# Patient Record
Sex: Male | Born: 1970 | Race: White | Hispanic: No | Smoking: Current every day smoker
Health system: Southern US, Community
[De-identification: ages and names within clinical notes are randomized; demographics above are authoritative.]

## PROBLEM LIST (undated history)

## (undated) ENCOUNTER — Emergency Department (HOSPITAL_BASED_OUTPATIENT_CLINIC_OR_DEPARTMENT_OTHER): Admission: EM | Source: Home / Self Care

## (undated) DIAGNOSIS — Z789 Other specified health status: Secondary | ICD-10-CM

## (undated) DIAGNOSIS — F1920 Other psychoactive substance dependence, uncomplicated: Secondary | ICD-10-CM

## (undated) DIAGNOSIS — F32A Depression, unspecified: Secondary | ICD-10-CM

## (undated) HISTORY — DX: Depression, unspecified: F32.A

## (undated) HISTORY — PX: NO PAST SURGERIES: SHX2092

## (undated) HISTORY — DX: Other psychoactive substance dependence, uncomplicated: F19.20

## (undated) SURGERY — Surgical Case
Anesthesia: *Unknown

---

## 2009-06-25 ENCOUNTER — Ambulatory Visit: Payer: Self-pay | Admitting: Diagnostic Radiology

## 2009-06-25 ENCOUNTER — Emergency Department (HOSPITAL_BASED_OUTPATIENT_CLINIC_OR_DEPARTMENT_OTHER): Admission: EM | Admit: 2009-06-25 | Discharge: 2009-06-25 | Payer: Self-pay | Admitting: Emergency Medicine

## 2011-06-28 ENCOUNTER — Emergency Department (HOSPITAL_COMMUNITY)
Admission: EM | Admit: 2011-06-28 | Discharge: 2011-06-28 | Disposition: A | Discharge status: Home / Self Care | Payer: Self-pay | Attending: Emergency Medicine | Admitting: Emergency Medicine

## 2011-06-28 ENCOUNTER — Encounter (HOSPITAL_COMMUNITY): Payer: Self-pay | Admitting: Emergency Medicine

## 2011-06-28 ENCOUNTER — Emergency Department (HOSPITAL_COMMUNITY): Payer: Self-pay

## 2011-06-28 DIAGNOSIS — M25519 Pain in unspecified shoulder: Secondary | ICD-10-CM | POA: Insufficient documentation

## 2011-06-28 DIAGNOSIS — M25512 Pain in left shoulder: Secondary | ICD-10-CM

## 2011-06-28 MED ORDER — IBUPROFEN 600 MG PO TABS: 600.0000 mg | ORAL_TABLET | Freq: Four times a day (QID) | ORAL | Status: AC | PRN

## 2011-06-28 MED ORDER — TRAMADOL HCL 50 MG PO TABS: 50.0000 mg | ORAL_TABLET | Freq: Four times a day (QID) | ORAL | Status: AC | PRN

## 2011-06-28 NOTE — ED Provider Notes (Signed)
History     CSN: [id number]  Arrival date & time 06/28/11  1340   First MD Initiated Contact with Patient 06/28/11 1353      Chief Complaint  Patient presents with  . Shoulder Pain    (Consider location/radiation/quality/duration/timing/severity/associated sxs/prior treatment) Patient is a 41 y.o. male presenting with shoulder pain. The history is provided by the patient.  Shoulder Pain This is a new problem. The current episode started 1 to 4 weeks ago. The problem occurs constantly. The problem has been unchanged. Associated symptoms include arthralgias. Pertinent negatives include no chills, fever, numbness or weakness.  Pt states he was moving transmission and it fell backwards pulling his shoulder back over his head. States pain in that shoulder since then. Pain with movement and when sleeping on it. No weakness or numbness in left hand. No other injuries.   No past medical history on file.  No past surgical history on file.  No family history on file.  History  Substance Use Topics  . Smoking status: Not on file  . Smokeless tobacco: Not on file  . Alcohol Use: Not on file      Review of Systems  Constitutional: Negative for fever and chills.  Respiratory: Negative.   Cardiovascular: Negative.   Musculoskeletal: Positive for arthralgias.  Skin: Negative.   Neurological: Negative for weakness and numbness.    Allergies  Review of patient's allergies indicates no known allergies.  Home Medications   Current Outpatient Rx  Name Route Sig Dispense Refill  . ACETAMINOPHEN 500 MG PO TABS Oral Take 1,000 mg by mouth every 6 (six) hours as needed. pain      There were no vitals taken for this visit.  Physical Exam  Nursing note and vitals reviewed. Constitutional: He is oriented to person, place, and time. He appears well-developed and well-nourished. No distress.  HENT:  Head: Normocephalic.  Eyes: Conjunctivae are normal.  Cardiovascular: Normal rate,  regular rhythm and normal heart sounds.   Pulmonary/Chest: Effort normal and breath sounds normal. No respiratory distress. He has no wheezes. He has no rales.  Musculoskeletal:       Normal appearing left shoulder. Pain with ROM of the shoulder. Pain with flexion and external rotation. Negative arm drop test. Distal radial pulses normal.   Neurological: He is alert and oriented to person, place, and time.  Skin: Skin is warm and dry.  Psychiatric: He has a normal mood and affect.    ED Course  Procedures (including critical care time)  No results found for this or any previous visit. Dg Shoulder Left  06/28/2011  *RADIOLOGY REPORT*  Clinical Data: Shoulder pain  LEFT SHOULDER - 2+ VIEW  Comparison: None.  Findings: No acute fracture and no dislocation.  Unremarkable soft tissues.  IMPRESSION: No acute bony pathology.  Original Report Authenticated By: Donavan Burnet, M.D.    Left shoulder pain for several weeks. X-ray normal. Possible rotator cuff injury vs labral tear. Will give ortho follow up for further testing.    1. Shoulder pain, left       MDM         Lottie Mussel, PA 06/28/11 1548

## 2011-06-28 NOTE — ED Notes (Signed)
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

## 2011-06-28 NOTE — Discharge Instructions (Signed)
Your x-ray is normal today. Ice your shoulder several times a day. Avoid lifting any heavy objects with left arm. Ibuprofen and ultram for pain. Follow up with orthopedics for further tests and treatment.    Shoulder Pain The shoulder is a ball and socket joint. The muscles and tendons (rotator cuff) are what keep the shoulder in its joint and stable. This collection of muscles and tendons holds in the head (ball) of the humerus (upper arm bone) in the fossa (cup) of the scapula (shoulder blade). Today no reason was found for your shoulder pain. Often pain in the shoulder may be treated conservatively with temporary immobilization. For example, holding the shoulder in one place using a sling for rest. Physical therapy may be needed if problems continue. HOME CARE INSTRUCTIONS   Apply ice to the sore area for 15 to 20 minutes, 3 to 4 times per day for the first 2 days. Put the ice in a plastic bag. Place a towel between the bag of ice and your skin.   If you have or were given a shoulder sling and straps, do not remove for as long as directed by your caregiver or until you see a caregiver for a follow-up examination. If you need to remove it to shower or bathe, move your arm as little as possible.   Sleep on several pillows at night to lessen swelling and pain.   Only take over-the-counter or prescription medicines for pain, discomfort, or fever as directed by your caregiver.   Keep any follow-up appointments in order to avoid any type of permanent shoulder disability or chronic pain problems.  SEEK MEDICAL CARE IF:   Pain in your shoulder increases or new pain develops in your arm, hand, or fingers.   Your hand or fingers are colder than your other hand.   You do not obtain pain relief with the medications or your pain becomes worse.  SEEK IMMEDIATE MEDICAL CARE IF:   Your arm, hand, or fingers are numb or tingling.   Your arm, hand, or fingers are swollen, painful, or turn white or blue.     You develop chest pain or shortness of breath.  MAKE SURE YOU:   Understand these instructions.   Will watch your condition.   Will get help right away if you are not doing well or get worse.  Document Released: 11/03/2004 Document Revised: 01/13/2011 Document Reviewed: 01/08/2011 [facility] Patient Information 2012 [place], [place].

## 2011-06-29 NOTE — ED Provider Notes (Signed)
Medical screening examination/treatment/procedure(s) were performed by non-physician practitioner and as supervising physician I was immediately available for consultation/collaboration.   Felton Buczynski E Mariyam Remington, MD 06/29/11 1200 

## 2012-02-16 ENCOUNTER — Encounter: Payer: Self-pay | Admitting: Family Medicine

## 2012-02-16 ENCOUNTER — Ambulatory Visit (INDEPENDENT_AMBULATORY_CARE_PROVIDER_SITE_OTHER): Payer: Self-pay | Admitting: Family Medicine

## 2012-02-16 VITALS — BP 100/68 | HR 72 | Temp 98.5°F | Resp 12 | Ht 72.0 in | Wt 208.5 lb

## 2012-02-16 DIAGNOSIS — K047 Periapical abscess without sinus: Secondary | ICD-10-CM

## 2012-02-16 DIAGNOSIS — L720 Epidermal cyst: Secondary | ICD-10-CM

## 2012-02-16 DIAGNOSIS — L723 Sebaceous cyst: Secondary | ICD-10-CM

## 2012-02-16 MED ORDER — PENICILLIN V POTASSIUM 500 MG PO TABS: 500.0000 mg | ORAL_TABLET | Freq: Three times a day (TID) | ORAL | Status: DC

## 2012-02-16 NOTE — Progress Notes (Signed)
  Subjective:    Patient ID: [name], male    DOB: 10-04-1970, 42 y.o.   MRN: [id number]  HPI New patient to establish. No chronic problems. Takes no medications. Has not seen a physician in quite some time. No prior surgeries. Nicotine use. No alcohol use. Works as a Curator.  Has the following acute issues:  Left lower gum pain the past couple days. History of dental abscess. Poor dentition. No regular dental care. No fever. No chills.  Cystic lesion left face anterior to ear. He has expressed some foul-smelling whitish material couple times. Noted about 2-3 months ago. Good appetite. No weight loss. No adenopathy.   Review of Systems  Constitutional: Negative for fever, chills, appetite change and unexpected weight change.  HENT: Negative for sore throat.   Respiratory: Negative for choking and shortness of breath.        Objective:   Physical Exam  Constitutional: He appears well-developed and well-nourished.  HENT:  Right Ear: External ear normal.  Left Ear: External ear normal.       Patient has mobile, cystic lesion left side of face anterior to ear. No erythema. No fluctuance. Nontender. About 1 cm diameter  Patient has some mild gum edema left lower gum. He has some purulence which is expressed from the gum in this region. He has very poor dentition.  Neck: Neck supple.  Cardiovascular: Normal rate and regular rhythm.   Pulmonary/Chest: Effort normal and breath sounds normal. No respiratory distress. He has no wheezes. He has no rales.  Lymphadenopathy:    He has no cervical adenopathy.          Assessment & Plan:  #1 dental abscess. Penicillin V 500 mg 3 times a day for 10 days. We have recommended he seek dental care #2 epidermal cyst left face. Patient requesting excision. No acute infection. Given location, referral to ENT

## 2012-02-16 NOTE — Patient Instructions (Addendum)
Epidermal Cyst An epidermal cyst is sometimes called a sebaceous cyst, epidermal inclusion cyst, or infundibular cyst. These cysts usually contain a substance that looks "pasty" or "cheesy" and may have a bad smell. This substance is a protein called keratin. Epidermal cysts are usually found on the face, neck, or trunk. They may also occur in the vaginal area or other parts of the genitalia of both men and women. Epidermal cysts are usually small, painless, slow-growing bumps or lumps that move freely under the skin. It is important not to try to pop them. This may cause an infection and lead to tenderness and swelling. CAUSES  Epidermal cysts may be caused by a deep penetrating injury to the skin or a plugged hair follicle, often associated with acne. SYMPTOMS  Epidermal cysts can become inflamed and cause:  Redness.  Tenderness.  Increased temperature of the skin over the bumps or lumps.  Grayish-white, bad smelling material that drains from the bump or lump. DIAGNOSIS  Epidermal cysts are easily diagnosed by your caregiver during an exam. Rarely, a tissue sample (biopsy) may be taken to rule out other conditions that may resemble epidermal cysts. TREATMENT   Epidermal cysts often get better and disappear on their own. They are rarely ever cancerous.  If a cyst becomes infected, it may become inflamed and tender. This may require opening and draining the cyst. Treatment with antibiotics may be necessary. When the infection is gone, the cyst may be removed with minor surgery.  Small, inflamed cysts can often be treated with antibiotics or by injecting steroid medicines.  Sometimes, epidermal cysts become large and bothersome. If this happens, surgical removal in your caregiver's office may be necessary. HOME CARE INSTRUCTIONS  Only take over-the-counter or prescription medicines as directed by your caregiver.  Take your antibiotics as directed. Finish them even if you start to feel  better. SEEK MEDICAL CARE IF:   Your cyst becomes tender, red, or swollen.  Your condition is not improving or is getting worse.  You have any other questions or concerns. MAKE SURE YOU:  Understand these instructions.  Will watch your condition.  Will get help right away if you are not doing well or get worse. Document Released: 12/26/2003 Document Revised: 04/18/2011 Document Reviewed: 08/02/2010 [facility] Patient Information 2013 [place], [place].  Dental Abscess A dental abscess is a collection of infected fluid (pus) from a bacterial infection in the inner part of the tooth (pulp). It usually occurs at the end of the tooth's root.  CAUSES   Severe tooth decay.  Trauma to the tooth that allows bacteria to enter into the pulp, such as a broken or chipped tooth. SYMPTOMS   Severe pain in and around the infected tooth.  Swelling and redness around the abscessed tooth or in the mouth or face.  Tenderness.  Pus drainage.  Bad breath.  Bitter taste in the mouth.  Difficulty swallowing.  Difficulty opening the mouth.  Nausea.  Vomiting.  Chills.  Swollen neck glands. DIAGNOSIS   A medical and dental history will be taken.  An examination will be performed by tapping on the abscessed tooth.  X-rays may be taken of the tooth to identify the abscess. TREATMENT The goal of treatment is to eliminate the infection. You may be prescribed antibiotic medicine to stop the infection from spreading. A root canal may be performed to save the tooth. If the tooth cannot be saved, it may be pulled (extracted) and the abscess may be drained.  HOME CARE  INSTRUCTIONS  Only take over-the-counter or prescription medicines for pain, fever, or discomfort as directed by your caregiver.  Rinse your mouth (gargle) often with salt water ( tsp salt in 8 oz of warm water) to relieve pain or swelling.  Do not drive after taking pain medicine (narcotics).  Do not apply heat to the  outside of your face.  Return to your dentist for further treatment as directed. SEEK MEDICAL CARE IF:  Your pain is not helped by medicine.  Your pain is getting worse instead of better. SEEK IMMEDIATE MEDICAL CARE IF:  You have a fever or persistent symptoms for more than 2 3 days.  You have a fever and your symptoms suddenly get worse.  You have chills or a very bad headache.  You have problems breathing or swallowing.  You have trouble opening your mouth.  You have swelling in the neck or around the eye. Document Released: 01/24/2005 Document Revised: 07/26/2011 Document Reviewed: 05/04/2010 [facility] Patient Information 2013 [place], [place].

## 2012-03-01 ENCOUNTER — Other Ambulatory Visit: Payer: Self-pay | Admitting: Otolaryngology

## 2014-12-01 ENCOUNTER — Emergency Department (HOSPITAL_COMMUNITY): Payer: Self-pay

## 2014-12-01 ENCOUNTER — Emergency Department (HOSPITAL_COMMUNITY)
Admission: EM | Admit: 2014-12-01 | Discharge: 2014-12-01 | Disposition: A | Discharge status: Home / Self Care | Payer: Self-pay | Attending: Emergency Medicine | Admitting: Emergency Medicine

## 2014-12-01 ENCOUNTER — Encounter (HOSPITAL_COMMUNITY): Payer: Self-pay | Admitting: Emergency Medicine

## 2014-12-01 DIAGNOSIS — R1013 Epigastric pain: Secondary | ICD-10-CM

## 2014-12-01 DIAGNOSIS — F17210 Nicotine dependence, cigarettes, uncomplicated: Secondary | ICD-10-CM | POA: Insufficient documentation

## 2014-12-01 DIAGNOSIS — K8020 Calculus of gallbladder without cholecystitis without obstruction: Secondary | ICD-10-CM | POA: Insufficient documentation

## 2014-12-01 LAB — COMPREHENSIVE METABOLIC PANEL
ALK PHOS: 65 U/L (ref 38–126)
ALT: 19 U/L (ref 17–63)
AST: 17 U/L (ref 15–41)
Albumin: 4.8 g/dL (ref 3.5–5.0)
Anion gap: 9 (ref 5–15)
BILIRUBIN TOTAL: 0.6 mg/dL (ref 0.3–1.2)
BUN: 22 mg/dL — AB (ref 6–20)
CALCIUM: 9.6 mg/dL (ref 8.9–10.3)
CHLORIDE: 102 mmol/L (ref 101–111)
CO2: 26 mmol/L (ref 22–32)
CREATININE: 1.38 mg/dL — AB (ref 0.61–1.24)
Glucose, Bld: 122 mg/dL — ABNORMAL HIGH (ref 65–99)
Potassium: 4 mmol/L (ref 3.5–5.1)
Sodium: 137 mmol/L (ref 135–145)
TOTAL PROTEIN: 8.2 g/dL — AB (ref 6.5–8.1)

## 2014-12-01 LAB — LIPASE, BLOOD: Lipase: 30 U/L (ref 11–51)

## 2014-12-01 LAB — URINALYSIS, ROUTINE W REFLEX MICROSCOPIC
BILIRUBIN URINE: NEGATIVE
GLUCOSE, UA: NEGATIVE mg/dL
KETONES UR: NEGATIVE mg/dL
Leukocytes, UA: NEGATIVE
NITRITE: NEGATIVE
PH: 5.5 (ref 5.0–8.0)
PROTEIN: NEGATIVE mg/dL
Specific Gravity, Urine: 1.027 (ref 1.005–1.030)
Urobilinogen, UA: 0.2 mg/dL (ref 0.0–1.0)

## 2014-12-01 LAB — CBC
HCT: 46.7 % (ref 39.0–52.0)
Hemoglobin: 16.1 g/dL (ref 13.0–17.0)
MCH: 30.7 pg (ref 26.0–34.0)
MCHC: 34.5 g/dL (ref 30.0–36.0)
MCV: 89.1 fL (ref 78.0–100.0)
PLATELETS: 214 10*3/uL (ref 150–400)
RBC: 5.24 MIL/uL (ref 4.22–5.81)
RDW: 12.9 % (ref 11.5–15.5)
WBC: 16.8 10*3/uL — AB (ref 4.0–10.5)

## 2014-12-01 LAB — URINE MICROSCOPIC-ADD ON

## 2014-12-01 LAB — I-STAT TROPONIN, ED: Troponin i, poc: 0 ng/mL (ref 0.00–0.08)

## 2014-12-01 MED ORDER — PANTOPRAZOLE SODIUM 40 MG IV SOLR
INTRAVENOUS | Status: AC
  Filled 2014-12-01: qty 40

## 2014-12-01 MED ORDER — GI COCKTAIL ~~LOC~~
30.0000 mL | Freq: Once | ORAL | Status: AC
  Administered 2014-12-01: 30 mL via ORAL

## 2014-12-01 MED ORDER — MORPHINE SULFATE (PF) 4 MG/ML IV SOLN
4.0000 mg | Freq: Once | INTRAVENOUS | Status: AC
  Administered 2014-12-01: 4 mg via INTRAVENOUS

## 2014-12-01 MED ORDER — MORPHINE SULFATE (PF) 4 MG/ML IV SOLN
INTRAVENOUS | Status: AC
  Filled 2014-12-01: qty 1

## 2014-12-01 MED ORDER — OXYCODONE-ACETAMINOPHEN 5-325 MG PO TABS: 1.0000 | ORAL_TABLET | ORAL | Status: DC | PRN

## 2014-12-01 MED ORDER — SODIUM CHLORIDE 0.9 % IV BOLUS (SEPSIS)
1000.0000 mL | Freq: Once | INTRAVENOUS | Status: AC
  Administered 2014-12-01: 1000 mL via INTRAVENOUS

## 2014-12-01 MED ORDER — GI COCKTAIL ~~LOC~~
ORAL | Status: AC
  Filled 2014-12-01: qty 30

## 2014-12-01 MED ORDER — ONDANSETRON HCL 4 MG/2ML IJ SOLN
4.0000 mg | Freq: Once | INTRAMUSCULAR | Status: AC | PRN
  Administered 2014-12-01: 4 mg via INTRAVENOUS
  Filled 2014-12-01: qty 2

## 2014-12-01 MED ORDER — ONDANSETRON HCL 4 MG/2ML IJ SOLN
INTRAMUSCULAR | Status: AC
  Filled 2014-12-01: qty 2

## 2014-12-01 MED ORDER — PANTOPRAZOLE SODIUM 40 MG IV SOLR
40.0000 mg | Freq: Once | INTRAVENOUS | Status: AC
  Administered 2014-12-01: 40 mg via INTRAVENOUS

## 2014-12-01 MED ORDER — ONDANSETRON HCL 4 MG/2ML IJ SOLN
4.0000 mg | Freq: Once | INTRAMUSCULAR | Status: AC
  Administered 2014-12-01: 4 mg via INTRAVENOUS

## 2014-12-01 MED ORDER — HYDROMORPHONE HCL 1 MG/ML IJ SOLN
1.0000 mg | Freq: Once | INTRAMUSCULAR | Status: AC
  Administered 2014-12-01: 1 mg via INTRAVENOUS
  Filled 2014-12-01: qty 1

## 2014-12-01 NOTE — ED Provider Notes (Signed)
CSN: [id number]     Arrival date & time 12/01/14  0358 History   First MD Initiated Contact with Patient 12/01/14 [phone]     Chief Complaint  Patient presents with  . Abdominal Pain    epigastric     (Consider location/radiation/quality/duration/timing/severity/associated sxs/prior Treatment) HPI  This is a 44 year old male who presents with abdominal pain. Patient reports onset of epigastric abdominal pain an approximate 7 PM. He reports that it is sharp and nonradiating. It is currently 10 out of 10. Nothing makes it better or worse. Patient reports one episode of isolated emesis. Denies any association with food. Denies alcohol use. Denies chest pain or shortness of breath. Reports normal bowel movements.  History reviewed. No pertinent past medical history. History reviewed. No pertinent past surgical history. Family History  Problem Relation Age of Onset  . Hypertension Mother   . Hypertension Maternal Grandmother   . Diabetes Maternal Grandmother   . Heart disease Paternal Grandfather   . Diabetes Paternal Grandfather    Social History  Substance Use Topics  . Smoking status: Current Every Day Smoker -- 1.50 packs/day for 20 years    Types: Cigarettes  . Smokeless tobacco: None  . Alcohol Use: No    Review of Systems  Constitutional: Negative.  Negative for fever.  Respiratory: Negative.  Negative for chest tightness and shortness of breath.   Cardiovascular: Negative.  Negative for chest pain.  Gastrointestinal: Positive for nausea, vomiting and abdominal pain. Negative for diarrhea.  Genitourinary: Negative.  Negative for dysuria.  Musculoskeletal: Negative for back pain.  Skin: Negative for rash.  Neurological: Negative for headaches.  All other systems reviewed and are negative.     Allergies  Review of patient's allergies indicates no known allergies.  Home Medications   Prior to Admission medications   Medication Sig Start Date End Date Taking?  Authorizing Provider  acetaminophen (TYLENOL) 500 MG tablet Take 1,000 mg by mouth every 6 (six) hours as needed for headache.   Yes Historical Provider, MD   BP 81/55 mmHg  Pulse 60  Temp(Src) 97.5 F (36.4 C) (Oral)  Resp 18  Ht  (1.854 m)  Wt 250 lb (113.399 kg)  BMI 32.99 kg/m2  SpO2 100% Physical Exam  Constitutional: He is oriented to person, place, and time. He appears well-developed and well-nourished.  Uncomfortable appearing, no acute distress  HENT:  Head: Normocephalic and atraumatic.  Cardiovascular: Normal rate, regular rhythm and normal heart sounds.   No murmur heard. Pulmonary/Chest: Effort normal and breath sounds normal. No respiratory distress. He has no wheezes.  Abdominal: Soft. Bowel sounds are normal. There is tenderness. There is no rebound and no guarding.  Mild tenderness to palpation of the epigastrium without rebound or guarding  Musculoskeletal: He exhibits no edema.  Neurological: He is alert and oriented to person, place, and time.  Skin: Skin is warm and dry.  Psychiatric: He has a normal mood and affect.  Nursing note and vitals reviewed.   ED Course  Procedures (including critical care time) Labs Review Labs Reviewed  LIPASE, BLOOD  COMPREHENSIVE METABOLIC PANEL  CBC  URINALYSIS, ROUTINE W REFLEX MICROSCOPIC (NOT AT [address])  I-STAT TROPOININ, ED    Imaging Review No results found. I have personally reviewed and evaluated these images and lab results as part of my medical decision-making.   EKG Interpretation ED ECG REPORT   Date: 12/01/2014  Rate: 56  Rhythm: normal sinus rhythm  QRS Axis: normal  Intervals: normal  ST/T Wave abnormalities: early repolarization  Conduction Disutrbances:nonspecific intraventricular conduction delay  Narrative Interpretation:   Old EKG Reviewed: none available  I have personally reviewed the EKG tracing and agree with the computerized printout as noted.       MDM   Final diagnoses:   None    Patient with abdominal pain. Acute onset. Denies any association with foods. However, it is epigastric. Patient initially given GI cocktail and Protonix. Differential includes GERD versus peptic ulcers versus gallbladder pathology. She continued to have pain and was given morphine. Due to issues in the lab, lab analysis was delayed.  Labs are still pending. On recheck, patient reports improvement after morphine. His exam is improved. Signed out to Dr. Juleen ChinaKohut.  If patient's labs are normal and his pain continues to improve, imaging may be able to be deferred. If LFTs or lipase is elevated, patient may need a right upper quadrant ultrasound.    Shon Batonourtney F Horton, MD 12/01/14 2308

## 2014-12-01 NOTE — Discharge Instructions (Signed)
Biliary Colic Biliary colic is a pain in the upper abdomen. The pain:  Is usually felt on the right side of the abdomen, but it may also be felt in the center of the abdomen, just below the breastbone (sternum).  May spread back toward the right shoulder blade.  May be steady or irregular.  May be accompanied by nausea and vomiting. Most of the time, the pain goes away in 1-5 hours. After the most intense pain passes, the abdomen may continue to ache mildly for about 24 hours. Biliary colic is caused by a blockage in the bile duct. The bile duct is a pathway that carries bile--a liquid that helps to digest fats--from the gallbladder to the small intestine. Biliary colic usually occurs after eating, when the digestive system demands bile. The pain develops when muscle cells contract forcefully to try to move the blockage so that bile can get by. HOME CARE INSTRUCTIONS  Take medicines only as directed by your health care provider.  Drink enough fluid to keep your urine clear or pale yellow.  Avoid fatty, greasy, and fried foods. These kinds of foods increase your body's demand for bile.  Avoid any foods that make your pain worse.  Avoid overeating.  Avoid having a large meal after fasting. SEEK MEDICAL CARE IF:  You develop a fever.  Your pain gets worse.  You vomit.  You develop nausea that prevents you from eating and drinking. SEEK IMMEDIATE MEDICAL CARE IF:  You suddenly develop a fever and shaking chills.  You develop a yellowish discoloration (jaundice) of:  Skin.  Whites of the eyes.  Mucous membranes.  You have continuous or severe pain that is not relieved with medicines.  You have nausea and vomiting that is not relieved with medicines.  You develop dizziness or you faint.   This information is not intended to replace advice given to you by your health care provider. Make sure you discuss any questions you have with your health care provider.     Cholelithiasis Cholelithiasis (also called gallstones) is a form of gallbladder disease in which gallstones form in your gallbladder. The gallbladder is an organ that stores bile made in the liver, which helps digest fats. Gallstones begin as small crystals and slowly grow into stones. Gallstone pain occurs when the gallbladder spasms and a gallstone is blocking the duct. Pain can also occur when a stone passes out of the duct.  RISK FACTORS  Being male.   Having multiple pregnancies. Health care providers sometimes advise removing diseased gallbladders before future pregnancies.   Being obese.  Eating a diet heavy in fried foods and fat.   Being older than 60 years and increasing age.   Prolonged use of medicines containing male hormones.   Having diabetes mellitus.   Rapidly losing weight.   Having a family history of gallstones (heredity).  SYMPTOMS  Nausea.   Vomiting.  Abdominal pain.   Yellowing of the skin (jaundice).   Sudden pain. It may persist from several minutes to several hours.  Fever.   Tenderness to the touch. In some cases, when gallstones do not move into the bile duct, people have no pain or symptoms. These are called "silent" gallstones.  TREATMENT Silent gallstones do not need treatment. In severe cases, emergency surgery may be required. Options for treatment include:  Surgery to remove the gallbladder. This is the most common treatment.  Medicines. These do not always work and may take 6-12 months or more to work.  Shock wave treatment (extracorporeal biliary lithotripsy). In this treatment an ultrasound machine sends shock waves to the gallbladder to break gallstones into smaller pieces that can pass into the intestines or be dissolved by medicine. HOME CARE INSTRUCTIONS   Only take over-the-counter or prescription medicines for pain, discomfort, or fever as directed by your health care provider.   Follow a low-fat diet  until seen again by your health care provider. Fat causes the gallbladder to contract, which can result in pain.   Follow up with your health care provider as directed. Attacks are almost always recurrent and surgery is usually required for permanent treatment.  SEEK IMMEDIATE MEDICAL CARE IF:   Your pain increases and is not controlled by medicines.   You have a fever or persistent symptoms for more than 2-3 days.   You have a fever and your symptoms suddenly get worse.   You have persistent nausea and vomiting.  MAKE SURE YOU:   Understand these instructions.  Will watch your condition.  Will get help right away if you are not doing well or get worse.   This information is not intended to replace advice given to you by your health care provider. Make sure you discuss any questions you have with your health care provider.   Document Released: 01/20/2005 Document Revised: 09/26/2012 Document Reviewed: 07/18/2012 Elsevier Interactive Patient Education 2016 [employer] Document Released: 06/27/2005 Document Revised: 06/10/2014 Document Reviewed: 11/05/2013 Elsevier Interactive Patient Education [employer]

## 2014-12-01 NOTE — ED Notes (Signed)
US at bedside

## 2014-12-01 NOTE — ED Provider Notes (Signed)
44yM with abdominal pain. W/u fairly unremarkable. CMP/lipase pending at time of sign out. Minimal elevation in Cr at 1.38. LFTs/lipase normal. Reports improved pain after morphine but worse again. Tender in epigastrium and RUQ on my exam. When discussing further with him, he does endorse similar pain intermittently previously and often post prandial. Suspect possibly biliary colic. Will redose meds. RUQ US.  11:52 AM Imaging does show cholelithiasis. Likely etiology of symptoms. CBD somewhat dilated. No US evidence of cholecystitis. LFTs normal. Afebrile. Leuckocytosis, but this is nonspecific. Pain controlled. At this point, I feel he can safely be discharged. PRN pain meds. Surgical FU.  It has been determined that no acute conditions requiring further emergency intervention are present at this time. The patient has been advised of the diagnosis and plan. I reviewed any labs and imaging including any potential incidental findings. We have discussed signs and symptoms that warrant return to the ED and they are listed in the discharge instructions.     Raeford RazorStephen Ezeriah Luty, MD 12/01/14 1154

## 2014-12-01 NOTE — ED Notes (Signed)
Blood draw unsuccessful.  2 attempts made blood began to flow and then stopped. Second stick no blood return

## 2014-12-01 NOTE — ED Notes (Signed)
Patient c/o epigastric pain. Patient states he hadn't felt good all day and was taking a nap when the pain woke him up. Emesis x1. Took tylenol for pain at 2330. Pain is described as a stabbing sensation.

## 2014-12-03 ENCOUNTER — Encounter (HOSPITAL_COMMUNITY): Payer: Self-pay | Admitting: Emergency Medicine

## 2014-12-03 ENCOUNTER — Emergency Department (HOSPITAL_COMMUNITY)
Admission: EM | Admit: 2014-12-03 | Discharge: 2014-12-03 | Disposition: A | Discharge status: Home / Self Care | Payer: Self-pay | Attending: Emergency Medicine | Admitting: Emergency Medicine

## 2014-12-03 DIAGNOSIS — Z8719 Personal history of other diseases of the digestive system: Secondary | ICD-10-CM | POA: Insufficient documentation

## 2014-12-03 DIAGNOSIS — Z760 Encounter for issue of repeat prescription: Secondary | ICD-10-CM | POA: Insufficient documentation

## 2014-12-03 DIAGNOSIS — R1011 Right upper quadrant pain: Secondary | ICD-10-CM | POA: Insufficient documentation

## 2014-12-03 DIAGNOSIS — Z720 Tobacco use: Secondary | ICD-10-CM | POA: Insufficient documentation

## 2014-12-03 MED ORDER — MELOXICAM 15 MG PO TABS: 15.0000 mg | ORAL_TABLET | Freq: Every day | ORAL | Status: DC

## 2014-12-03 MED ORDER — DICYCLOMINE HCL 20 MG PO TABS: 20.0000 mg | ORAL_TABLET | Freq: Two times a day (BID) | ORAL | Status: DC

## 2014-12-03 NOTE — ED Notes (Signed)
Pt states he was seen on Sunday and was diagnosed with gallbladder issues  Pt states he has an appt to see the surgeon on Monday but he has run out of pain medication   Pt states the pain is okay as long as he is on the medication but returns when he does not take it

## 2014-12-03 NOTE — ED Provider Notes (Signed)
CSN: [id number]     Arrival date & time 12/03/14  2041 History  By signing my name below, I, Gonzella Lex, attest that this documentation has been prepared under the direction and in the presence of Emilia Beck, PA-C Electronically Signed: Gonzella Lex, Scribe. 12/03/2014. 10:26 PM.    Chief Complaint  Patient presents with  . Medication Refill      The history is provided by the patient. No language interpreter was used.    HPI Comments: [name] is a 44 y.o. male who presents to the Emergency Department complaining of persistent RUQ pain. He was seen in the ED on 12/01/2014 for the same. Pt was diagnosed with gallstones and discharged with pain medications. He states he stopped eating greasy foods and sodas. The pain has returned due to being out of pain meds. Pt denies fever and has follow up appointment with surgeon on 12/08/2014.   History reviewed. No pertinent past medical history. History reviewed. No pertinent past surgical history. Family History  Problem Relation Age of Onset  . Hypertension Mother   . Hypertension Maternal Grandmother   . Diabetes Maternal Grandmother   . Heart disease Paternal Grandfather   . Diabetes Paternal Grandfather    Social History  Substance Use Topics  . Smoking status: Current Every Day Smoker -- 1.50 packs/day for 20 years    Types: Cigarettes  . Smokeless tobacco: None  . Alcohol Use: No    Review of Systems  Constitutional: Negative for fever and chills.  Gastrointestinal: Positive for abdominal pain ( RUQ).  All other systems reviewed and are negative.     Allergies  Review of patient's allergies indicates no known allergies.  Home Medications   Prior to Admission medications   Medication Sig Start Date End Date Taking? Authorizing Provider  acetaminophen (TYLENOL) 500 MG tablet Take 1,000 mg by mouth every 6 (six) hours as needed for headache.    Historical Provider, MD   oxyCODONE-acetaminophen (PERCOCET/ROXICET) 5-325 MG tablet Take 1 tablet by mouth every 4 (four) hours as needed for severe pain. 12/01/14   Raeford Razor, MD   BP 152/94 mmHg  Pulse 98  Temp(Src) 98.3 F (36.8 C) (Oral)  Resp 18  SpO2 98% Physical Exam  Constitutional: He is oriented to person, place, and time. He appears well-developed and well-nourished. No distress.  HENT:  Head: Normocephalic and atraumatic.  Eyes: Conjunctivae and EOM are normal.  Neck: Normal range of motion.  Cardiovascular: Normal rate, regular rhythm and normal heart sounds.  Exam reveals no gallop and no friction rub.   No murmur heard. Pulmonary/Chest: Effort normal and breath sounds normal. He has no wheezes. He has no rales. He exhibits no tenderness.  Abdominal: Soft. He exhibits no distension. There is tenderness ( RUQ).  Musculoskeletal: Normal range of motion.  Neurological: He is alert and oriented to person, place, and time. Coordination normal.  Speech is goal-oriented. Moves limbs without ataxia.   Skin: Skin is warm and dry.  Psychiatric: He has a normal mood and affect. His behavior is normal.  Nursing note and vitals reviewed.   ED Course  Procedures  DIAGNOSTIC STUDIES:    Oxygen Saturation is 98% on room air, normal by my interpretation.   COORDINATION OF CARE:  10:04 PM Will discharge with a new prescription for pain medication.  Discussed treatment plan with pt at bedside and pt agreed to plan.     Labs Review Labs Reviewed - No data to display  Imaging Review    EKG Interpretation None      MDM   Final diagnoses:  Continuous RUQ abdominal pain  Medication refill    Patient advised that he will not be receiving additional narcotic pain medication but I will prescribe bentyl and mobic. Vitals stable and patient afebrile. No acute changes in symptoms.   I personally performed the services described in this documentation, which was scribed in my presence. The  recorded information has been reviewed and is accurate.      Emilia BeckKaitlyn Denim Kalmbach, PA-C 12/04/14 0406  Lavera Guiseana Duo Liu, MD 12/04/14 1037

## 2014-12-03 NOTE — Discharge Instructions (Signed)
Take mobic and bentyl as needed for pain. Follow up with your doctor as needed.

## 2015-01-22 ENCOUNTER — Ambulatory Visit: Payer: Self-pay | Admitting: Family Medicine

## 2015-02-26 ENCOUNTER — Ambulatory Visit: Payer: Self-pay | Admitting: Family Medicine

## 2015-03-26 ENCOUNTER — Ambulatory Visit: Payer: Self-pay | Admitting: Family Medicine

## 2016-02-03 ENCOUNTER — Encounter (HOSPITAL_COMMUNITY): Payer: Self-pay

## 2016-02-03 ENCOUNTER — Emergency Department (HOSPITAL_COMMUNITY)
Admission: EM | Admit: 2016-02-03 | Discharge: 2016-02-04 | Disposition: A | Discharge status: Home / Self Care | Payer: Self-pay | Attending: Emergency Medicine | Admitting: Emergency Medicine

## 2016-02-03 DIAGNOSIS — F17210 Nicotine dependence, cigarettes, uncomplicated: Secondary | ICD-10-CM | POA: Insufficient documentation

## 2016-02-03 DIAGNOSIS — R1013 Epigastric pain: Secondary | ICD-10-CM | POA: Insufficient documentation

## 2016-02-03 DIAGNOSIS — Z79899 Other long term (current) drug therapy: Secondary | ICD-10-CM | POA: Insufficient documentation

## 2016-02-03 LAB — CBC WITH DIFFERENTIAL/PLATELET
BASOS ABS: 0 10*3/uL (ref 0.0–0.1)
BASOS PCT: 0 %
Eosinophils Absolute: 0 10*3/uL (ref 0.0–0.7)
Eosinophils Relative: 1 %
HCT: 46.1 % (ref 39.0–52.0)
HEMOGLOBIN: 16.1 g/dL (ref 13.0–17.0)
Lymphocytes Relative: 16 %
Lymphs Abs: 1.3 10*3/uL (ref 0.7–4.0)
MCH: 30.4 pg (ref 26.0–34.0)
MCHC: 34.9 g/dL (ref 30.0–36.0)
MCV: 87.1 fL (ref 78.0–100.0)
MONOS PCT: 6 %
Monocytes Absolute: 0.5 10*3/uL (ref 0.1–1.0)
NEUTROS PCT: 77 %
Neutro Abs: 6.4 10*3/uL (ref 1.7–7.7)
Platelets: 177 10*3/uL (ref 150–400)
RBC: 5.29 MIL/uL (ref 4.22–5.81)
RDW: 13.3 % (ref 11.5–15.5)
WBC: 8.2 10*3/uL (ref 4.0–10.5)

## 2016-02-03 LAB — COMPREHENSIVE METABOLIC PANEL
ALT: 213 U/L — AB (ref 17–63)
ANION GAP: 9 (ref 5–15)
AST: 302 U/L — ABNORMAL HIGH (ref 15–41)
Albumin: 4.7 g/dL (ref 3.5–5.0)
Alkaline Phosphatase: 100 U/L (ref 38–126)
BUN: 24 mg/dL — ABNORMAL HIGH (ref 6–20)
CHLORIDE: 104 mmol/L (ref 101–111)
CO2: 25 mmol/L (ref 22–32)
CREATININE: 1.01 mg/dL (ref 0.61–1.24)
Calcium: 9.3 mg/dL (ref 8.9–10.3)
GFR calc non Af Amer: >60 mL/min (ref >60)
Glucose, Bld: 117 mg/dL — ABNORMAL HIGH (ref 65–99)
Potassium: 4.9 mmol/L (ref 3.5–5.1)
SODIUM: 138 mmol/L (ref 135–145)
Total Bilirubin: 0.9 mg/dL (ref 0.3–1.2)
Total Protein: 8.1 g/dL (ref 6.5–8.1)

## 2016-02-03 LAB — LIPASE, BLOOD: LIPASE: 25 U/L (ref 11–51)

## 2016-02-03 MED ORDER — OMEPRAZOLE 20 MG PO CPDR
20.0000 mg | DELAYED_RELEASE_CAPSULE | Freq: Every day | ORAL | 0 refills | Status: DC
Start: 2016-02-03 — End: 2016-03-29

## 2016-02-03 MED ORDER — FENTANYL CITRATE (PF) 100 MCG/2ML IJ SOLN
50.0000 ug | Freq: Once | INTRAMUSCULAR | Status: AC
  Administered 2016-02-03: 50 ug via INTRAVENOUS
  Filled 2016-02-03: qty 2

## 2016-02-03 MED ORDER — FAMOTIDINE-CA CARB-MAG HYDROX 10-800-165 MG PO CHEW: 1.0000 | CHEWABLE_TABLET | Freq: Three times a day (TID) | ORAL | 0 refills | Status: DC

## 2016-02-03 MED ORDER — GI COCKTAIL ~~LOC~~
30.0000 mL | Freq: Once | ORAL | Status: AC
  Administered 2016-02-03: 30 mL via ORAL
  Filled 2016-02-03: qty 30

## 2016-02-03 MED ORDER — ONDANSETRON HCL 4 MG/2ML IJ SOLN
4.0000 mg | Freq: Once | INTRAMUSCULAR | Status: AC
  Administered 2016-02-03: 4 mg via INTRAVENOUS
  Filled 2016-02-03: qty 2

## 2016-02-03 MED ORDER — SODIUM CHLORIDE 0.9 % IV BOLUS (SEPSIS)
1000.0000 mL | Freq: Once | INTRAVENOUS | Status: AC
  Administered 2016-02-03: 1000 mL via INTRAVENOUS

## 2016-02-03 NOTE — Discharge Instructions (Signed)
As discussed, your evaluation today has been largely reassuring.  But, it is important that you monitor your condition carefully, and do not hesitate to return to the ED if you develop new, or concerning changes in your condition.  Otherwise, please follow-up with our gastroenterologists for appropriate ongoing care.  Please take all medication as directed.

## 2016-02-03 NOTE — ED Provider Notes (Signed)
[facility]-EMERGENCY DEPT Provider Note   CSN: [id number] Arrival date & time: 02/03/16  2159     History   Chief Complaint Chief Complaint  Patient presents with  . Abdominal Pain  . Nausea    HPI [name] is a 45 y.o. male.  HPI Patient presents with concern of epigastric pain. And began earlier today, about 6 hours ago. Since onset the pain has been persistent, is focally about the epigastrium, sore, severe, burning. No radiation anywhere There is associated nausea, but no vomiting. No relief with OTC medication. Patient acknowledges a history of prior gallbladder disease, never followed up with surgery for cholecystectomy. He states that he is otherwise well. He does smoke, does not drink.  No past medical history on file.  There are no active problems to display for this patient.   History reviewed. No pertinent surgical history.     Home Medications    Prior to Admission medications   Medication Sig Start Date End Date Taking? Authorizing Provider  acetaminophen (TYLENOL) 500 MG tablet Take 1,000 mg by mouth every 6 (six) hours as needed for headache.   Yes Historical Provider, MD  naproxen sodium (ANAPROX) 220 MG tablet Take 440 mg by mouth once.   Yes Historical Provider, MD  dicyclomine (BENTYL) 20 MG tablet Take 1 tablet (20 mg total) by mouth 2 (two) times daily. Patient not taking: Reported on 02/03/2016 12/03/14   Emilia BeckKaitlyn Szekalski, PA-C  meloxicam (MOBIC) 15 MG tablet Take 1 tablet (15 mg total) by mouth daily. Patient not taking: Reported on 02/03/2016 12/03/14   Emilia BeckKaitlyn Szekalski, PA-C  oxyCODONE-acetaminophen (PERCOCET/ROXICET) 5-325 MG tablet Take 1 tablet by mouth every 4 (four) hours as needed for severe pain. Patient not taking: Reported on 02/03/2016 12/01/14   Raeford RazorStephen Kohut, MD    Family History Family History  Problem Relation Age of Onset  . Hypertension Mother   . Hypertension Maternal Grandmother   . Diabetes Maternal  Grandmother   . Heart disease Paternal Grandfather   . Diabetes Paternal Grandfather     Social History Social History  Substance Use Topics  . Smoking status: Current Every Day Smoker    Packs/day: 1.50    Years: 20.00    Types: Cigarettes  . Smokeless tobacco: Not on file  . Alcohol use No     Allergies   Patient has no known allergies.   Review of Systems Review of Systems  Constitutional:       Per HPI, otherwise negative  HENT:       Per HPI, otherwise negative  Respiratory:       Per HPI, otherwise negative  Cardiovascular:       Per HPI, otherwise negative  Gastrointestinal: Positive for nausea. Negative for vomiting.  Endocrine:       Negative aside from HPI  Genitourinary:       Neg aside from HPI   Musculoskeletal:       Per HPI, otherwise negative  Skin: Negative.   Neurological: Negative for syncope.     Physical Exam Updated Vital Signs BP (!) 143/107 (BP Location: Left Arm)   Pulse (!) 55   Temp 98.6 F (37 C) (Oral)   Resp 18   Ht 6\' 2"  (1.88 m)   Wt 230 lb (104.3 kg)   SpO2 96%   BMI 29.53 kg/m   Physical Exam  Constitutional: He is oriented to person, place, and time. He appears well-developed. No distress.  HENT:  Head: Normocephalic and  atraumatic.  Eyes: Conjunctivae and EOM are normal.  Cardiovascular: Normal rate and regular rhythm.   Pulmonary/Chest: Effort normal. No stridor. No respiratory distress.  Abdominal: He exhibits no distension. There is tenderness.  Tenderness only about the epigastrium, with no lateral, nor lower abdominal pain  Musculoskeletal: He exhibits no edema.  Neurological: He is alert and oriented to person, place, and time.  Skin: Skin is warm and dry.  Psychiatric: He has a normal mood and affect.  Nursing note and vitals reviewed.    ED Treatments / Results  Labs (all labs ordered are listed, but only abnormal results are displayed) Labs Reviewed  CBC WITH DIFFERENTIAL/PLATELET  LIPASE, BLOOD   COMPREHENSIVE METABOLIC PANEL     Radiology No results found.  Procedures Procedures (including critical care time)  Medications Ordered in ED Medications  sodium chloride 0.9 % bolus 1,000 mL (1,000 mLs Intravenous New Bag/Given 02/03/16 2321)  ondansetron (ZOFRAN) injection 4 mg (4 mg Intravenous Given 02/03/16 2320)  fentaNYL (SUBLIMAZE) injection 50 mcg (50 mcg Intravenous Given 02/03/16 2320)  gi cocktail (Maalox,Lidocaine,Donnatal) (30 mLs Oral Given 02/03/16 2320)     Initial Impression / Assessment and Plan / ED Course  I have reviewed the triage vital signs and the nursing notes.  Pertinent labs & imaging results that were available during my care of the patient were reviewed by me and considered in my medical decision making (see chart for details).  Clinical Course     On exam the patient is awake and alert, in no distress, states that he feels better. With his wife we discussed all findings, including concern for gastroesophageal etiology given the reassuring labs, improvement with GI cocktail, and description of epigastric pain. I stressed the importance of follow-up with gastroenterology, patient started on PPI, with bridge therapy via Pepcid. Absent peritonitis, abnormal labs, there is low suspicion for other acute abdominal processes, and the patient is awake and alert, appropriate for outpatient follow-up.  Final Clinical Impressions(s) / ED Diagnoses   Final diagnoses:  Epigastric pain    New Prescriptions New Prescriptions   FAMOTIDINE-CALCIUM CARBONATE-MAGNESIUM HYDROXIDE (PEPCID COMPLETE) 10-800-165 MG CHEWABLE TABLET    Chew 1 tablet by mouth 3 (three) times daily before meals.   OMEPRAZOLE (PRILOSEC) 20 MG CAPSULE    Take 1 capsule (20 mg total) by mouth daily. Take one tablet daily     Gerhard Munchobert Xianna Siverling, MD 02/04/16 [phone]

## 2016-02-03 NOTE — ED Triage Notes (Signed)
Pt brought in by EMS c/o Epigastric pain 10/10,  With N/V. X 4 hours.Per EMS pt was told that he had to have his gallbladder removed 8 months ago, pt has had recent flair up 3 months ago. Pt was give 4mg  of Zofran.

## 2016-02-10 ENCOUNTER — Encounter (HOSPITAL_BASED_OUTPATIENT_CLINIC_OR_DEPARTMENT_OTHER): Payer: Self-pay | Admitting: Emergency Medicine

## 2016-02-10 ENCOUNTER — Emergency Department (HOSPITAL_BASED_OUTPATIENT_CLINIC_OR_DEPARTMENT_OTHER): Payer: BLUE CROSS/BLUE SHIELD

## 2016-02-10 ENCOUNTER — Inpatient Hospital Stay (HOSPITAL_BASED_OUTPATIENT_CLINIC_OR_DEPARTMENT_OTHER)
Admission: EM | Admit: 2016-02-10 | Discharge: 2016-02-16 | DRG: 417 | Disposition: A | Discharge status: Home Health | Payer: BLUE CROSS/BLUE SHIELD | Attending: Surgery | Admitting: Surgery

## 2016-02-10 DIAGNOSIS — K822 Perforation of gallbladder: Secondary | ICD-10-CM | POA: Diagnosis present

## 2016-02-10 DIAGNOSIS — K8044 Calculus of bile duct with chronic cholecystitis without obstruction: Secondary | ICD-10-CM

## 2016-02-10 DIAGNOSIS — K829 Disease of gallbladder, unspecified: Secondary | ICD-10-CM

## 2016-02-10 DIAGNOSIS — Z720 Tobacco use: Secondary | ICD-10-CM | POA: Diagnosis present

## 2016-02-10 DIAGNOSIS — R12 Heartburn: Secondary | ICD-10-CM | POA: Diagnosis present

## 2016-02-10 DIAGNOSIS — K810 Acute cholecystitis: Secondary | ICD-10-CM | POA: Diagnosis present

## 2016-02-10 DIAGNOSIS — K839 Disease of biliary tract, unspecified: Secondary | ICD-10-CM

## 2016-02-10 DIAGNOSIS — F419 Anxiety disorder, unspecified: Secondary | ICD-10-CM | POA: Diagnosis present

## 2016-02-10 DIAGNOSIS — R1011 Right upper quadrant pain: Secondary | ICD-10-CM | POA: Diagnosis present

## 2016-02-10 DIAGNOSIS — K8066 Calculus of gallbladder and bile duct with acute and chronic cholecystitis without obstruction: Secondary | ICD-10-CM | POA: Diagnosis present

## 2016-02-10 DIAGNOSIS — Z23 Encounter for immunization: Secondary | ICD-10-CM

## 2016-02-10 DIAGNOSIS — F17210 Nicotine dependence, cigarettes, uncomplicated: Secondary | ICD-10-CM | POA: Diagnosis present

## 2016-02-10 DIAGNOSIS — K8000 Calculus of gallbladder with acute cholecystitis without obstruction: Secondary | ICD-10-CM

## 2016-02-10 DIAGNOSIS — K812 Acute cholecystitis with chronic cholecystitis: Secondary | ICD-10-CM

## 2016-02-10 HISTORY — DX: Other specified health status: Z78.9

## 2016-02-10 LAB — COMPREHENSIVE METABOLIC PANEL
ALBUMIN: 3.9 g/dL (ref 3.5–5.0)
ALT: 50 U/L (ref 17–63)
ANION GAP: 7 (ref 5–15)
AST: 28 U/L (ref 15–41)
Alkaline Phosphatase: 111 U/L (ref 38–126)
BUN: 19 mg/dL (ref 6–20)
CHLORIDE: 101 mmol/L (ref 101–111)
CO2: 28 mmol/L (ref 22–32)
Calcium: 9.2 mg/dL (ref 8.9–10.3)
Creatinine, Ser: 0.94 mg/dL (ref 0.61–1.24)
GFR calc Af Amer: >60 mL/min (ref >60)
GFR calc non Af Amer: >60 mL/min (ref >60)
Glucose, Bld: 93 mg/dL (ref 65–99)
POTASSIUM: 4.3 mmol/L (ref 3.5–5.1)
SODIUM: 136 mmol/L (ref 135–145)
TOTAL PROTEIN: 7.4 g/dL (ref 6.5–8.1)
Total Bilirubin: 0.6 mg/dL (ref 0.3–1.2)

## 2016-02-10 LAB — CBC WITH DIFFERENTIAL/PLATELET
BASOS ABS: 0 10*3/uL (ref 0.0–0.1)
Basophils Relative: 0 %
EOS PCT: 1 %
Eosinophils Absolute: 0.1 10*3/uL (ref 0.0–0.7)
HEMATOCRIT: 43.5 % (ref 39.0–52.0)
Hemoglobin: 14.5 g/dL (ref 13.0–17.0)
LYMPHS ABS: 2 10*3/uL (ref 0.7–4.0)
LYMPHS PCT: 19 %
MCH: 29.8 pg (ref 26.0–34.0)
MCHC: 33.3 g/dL (ref 30.0–36.0)
MCV: 89.5 fL (ref 78.0–100.0)
Monocytes Absolute: 1 10*3/uL (ref 0.1–1.0)
Monocytes Relative: 9 %
NEUTROS ABS: 7.6 10*3/uL (ref 1.7–7.7)
Neutrophils Relative %: 71 %
PLATELETS: 225 10*3/uL (ref 150–400)
RBC: 4.86 MIL/uL (ref 4.22–5.81)
RDW: 12.9 % (ref 11.5–15.5)
WBC: 10.7 10*3/uL — AB (ref 4.0–10.5)

## 2016-02-10 LAB — LIPASE, BLOOD: Lipase: 19 U/L (ref 11–51)

## 2016-02-10 MED ORDER — ACETAMINOPHEN 500 MG PO TABS
1000.0000 mg | ORAL_TABLET | Freq: Three times a day (TID) | ORAL | Status: DC
  Administered 2016-02-10 – 2016-02-16 (×14): 1000 mg via ORAL
  Filled 2016-02-10 (×15): qty 2

## 2016-02-10 MED ORDER — LACTATED RINGERS IV SOLN
INTRAVENOUS | Status: DC
  Administered 2016-02-10 – 2016-02-11 (×4): via INTRAVENOUS

## 2016-02-10 MED ORDER — ONDANSETRON 4 MG PO TBDP: 4.0000 mg | ORAL_TABLET | Freq: Four times a day (QID) | ORAL | Status: DC | PRN

## 2016-02-10 MED ORDER — PHENOL 1.4 % MT LIQD: 2.0000 | OROMUCOSAL | Status: DC | PRN

## 2016-02-10 MED ORDER — DIPHENHYDRAMINE HCL 12.5 MG/5ML PO ELIX: 12.5000 mg | ORAL_SOLUTION | Freq: Four times a day (QID) | ORAL | Status: DC | PRN

## 2016-02-10 MED ORDER — MENTHOL 3 MG MT LOZG: 1.0000 | LOZENGE | OROMUCOSAL | Status: DC | PRN

## 2016-02-10 MED ORDER — VITAMIN C 500 MG PO TABS
500.0000 mg | ORAL_TABLET | Freq: Two times a day (BID) | ORAL | Status: DC
  Administered 2016-02-10 – 2016-02-16 (×10): 500 mg via ORAL
  Filled 2016-02-10 (×10): qty 1

## 2016-02-10 MED ORDER — DIPHENHYDRAMINE HCL 50 MG/ML IJ SOLN: 12.5000 mg | Freq: Four times a day (QID) | INTRAMUSCULAR | Status: DC | PRN

## 2016-02-10 MED ORDER — MAGIC MOUTHWASH: 15.0000 mL | Freq: Four times a day (QID) | ORAL | Status: DC | PRN

## 2016-02-10 MED ORDER — PROCHLORPERAZINE EDISYLATE 5 MG/ML IJ SOLN: 5.0000 mg | INTRAMUSCULAR | Status: DC | PRN

## 2016-02-10 MED ORDER — GABAPENTIN 300 MG PO CAPS: 300.0000 mg | ORAL_CAPSULE | ORAL | Status: DC

## 2016-02-10 MED ORDER — LIP MEDEX EX OINT
1.0000 "application " | TOPICAL_OINTMENT | Freq: Two times a day (BID) | CUTANEOUS | Status: DC
  Administered 2016-02-10 – 2016-02-16 (×10): 1 via TOPICAL
  Filled 2016-02-10: qty 7

## 2016-02-10 MED ORDER — ENOXAPARIN SODIUM 40 MG/0.4ML ~~LOC~~ SOLN
40.0000 mg | SUBCUTANEOUS | Status: DC
  Administered 2016-02-11 – 2016-02-16 (×5): 40 mg via SUBCUTANEOUS
  Filled 2016-02-10 (×5): qty 0.4

## 2016-02-10 MED ORDER — METHOCARBAMOL 1000 MG/10ML IJ SOLN
1000.0000 mg | Freq: Four times a day (QID) | INTRAVENOUS | Status: DC | PRN
  Filled 2016-02-10: qty 10

## 2016-02-10 MED ORDER — LACTATED RINGERS IV BOLUS (SEPSIS): 1000.0000 mL | Freq: Three times a day (TID) | INTRAVENOUS | Status: AC | PRN

## 2016-02-10 MED ORDER — ONDANSETRON HCL 4 MG/2ML IJ SOLN
4.0000 mg | Freq: Four times a day (QID) | INTRAMUSCULAR | Status: DC | PRN
  Administered 2016-02-14: 4 mg via INTRAVENOUS

## 2016-02-10 MED ORDER — LACTATED RINGERS IV BOLUS (SEPSIS)
1000.0000 mL | Freq: Once | INTRAVENOUS | Status: AC
  Administered 2016-02-10: 1000 mL via INTRAVENOUS

## 2016-02-10 MED ORDER — CELECOXIB 400 MG PO CAPS
400.0000 mg | ORAL_CAPSULE | ORAL | Status: DC
  Filled 2016-02-10: qty 1

## 2016-02-10 MED ORDER — ACETAMINOPHEN 500 MG PO TABS: 1000.0000 mg | ORAL_TABLET | ORAL | Status: DC

## 2016-02-10 MED ORDER — ALUM & MAG HYDROXIDE-SIMETH 200-200-20 MG/5ML PO SUSP: 30.0000 mL | Freq: Four times a day (QID) | ORAL | Status: DC | PRN

## 2016-02-10 MED ORDER — METHOCARBAMOL 1000 MG/10ML IJ SOLN: 1000.0000 mg | Freq: Four times a day (QID) | INTRAVENOUS | Status: DC | PRN

## 2016-02-10 MED ORDER — CHLORHEXIDINE GLUCONATE CLOTH 2 % EX PADS: 6.0000 | MEDICATED_PAD | Freq: Once | CUTANEOUS | Status: DC

## 2016-02-10 MED ORDER — METHOCARBAMOL 500 MG PO TABS
1000.0000 mg | ORAL_TABLET | Freq: Four times a day (QID) | ORAL | Status: DC | PRN
  Administered 2016-02-10 – 2016-02-11 (×2): 1000 mg via ORAL
  Filled 2016-02-10 (×2): qty 2

## 2016-02-10 MED ORDER — HYDRALAZINE HCL 20 MG/ML IJ SOLN: 10.0000 mg | INTRAMUSCULAR | Status: DC | PRN

## 2016-02-10 MED ORDER — METHOCARBAMOL 500 MG PO TABS: 1000.0000 mg | ORAL_TABLET | Freq: Four times a day (QID) | ORAL | Status: DC | PRN

## 2016-02-10 MED ORDER — SIMETHICONE 80 MG PO CHEW: 40.0000 mg | CHEWABLE_TABLET | Freq: Four times a day (QID) | ORAL | Status: DC | PRN

## 2016-02-10 MED ORDER — CHLORHEXIDINE GLUCONATE CLOTH 2 % EX PADS
6.0000 | MEDICATED_PAD | Freq: Once | CUTANEOUS | Status: AC
  Administered 2016-02-10: 6 via TOPICAL

## 2016-02-10 MED ORDER — IBUPROFEN 200 MG PO TABS: 400.0000 mg | ORAL_TABLET | Freq: Four times a day (QID) | ORAL | Status: DC | PRN

## 2016-02-10 MED ORDER — HYDROMORPHONE HCL 2 MG/ML IJ SOLN
0.5000 mg | INTRAMUSCULAR | Status: DC | PRN
  Administered 2016-02-11 – 2016-02-12 (×5): 2 mg via INTRAVENOUS
  Administered 2016-02-13: 1 mg via INTRAVENOUS
  Administered 2016-02-14 (×2): 2 mg via INTRAVENOUS
  Administered 2016-02-14: 1 mg via INTRAVENOUS
  Filled 2016-02-10 (×9): qty 1

## 2016-02-10 MED ORDER — NICOTINE 14 MG/24HR TD PT24
14.0000 mg | MEDICATED_PATCH | Freq: Every day | TRANSDERMAL | Status: DC
  Administered 2016-02-10 – 2016-02-16 (×5): 14 mg via TRANSDERMAL
  Filled 2016-02-10 (×5): qty 1

## 2016-02-10 MED ORDER — OXYCODONE HCL 5 MG PO TABS
5.0000 mg | ORAL_TABLET | ORAL | Status: DC | PRN
  Administered 2016-02-10 – 2016-02-16 (×22): 10 mg via ORAL
  Filled 2016-02-10 (×22): qty 2

## 2016-02-10 MED ORDER — PNEUMOCOCCAL VAC POLYVALENT 25 MCG/0.5ML IJ INJ
0.5000 mL | INJECTION | INTRAMUSCULAR | Status: DC
  Filled 2016-02-10: qty 0.5

## 2016-02-10 MED ORDER — DEXTROSE 5 % IV SOLN
2.0000 g | INTRAVENOUS | Status: DC
  Administered 2016-02-10 – 2016-02-15 (×6): 2 g via INTRAVENOUS
  Filled 2016-02-10 (×7): qty 2

## 2016-02-10 MED ORDER — GUAIFENESIN-DM 100-10 MG/5ML PO SYRP: 10.0000 mL | ORAL_SOLUTION | ORAL | Status: DC | PRN

## 2016-02-10 MED ORDER — METOCLOPRAMIDE HCL 5 MG/ML IJ SOLN: 5.0000 mg | Freq: Four times a day (QID) | INTRAMUSCULAR | Status: DC | PRN

## 2016-02-10 MED ORDER — INFLUENZA VAC SPLIT QUAD 0.5 ML IM SUSY
0.5000 mL | PREFILLED_SYRINGE | INTRAMUSCULAR | Status: DC
  Filled 2016-02-10: qty 0.5

## 2016-02-10 MED ORDER — FENTANYL CITRATE (PF) 100 MCG/2ML IJ SOLN
50.0000 ug | INTRAMUSCULAR | Status: AC | PRN
  Administered 2016-02-10 – 2016-02-11 (×3): 50 ug via INTRAVENOUS
  Filled 2016-02-10 (×3): qty 2

## 2016-02-10 NOTE — ED Provider Notes (Signed)
Patient was seen and evaluated on arrival to the emergency department.  Persistent RUQ pain, pain meds ordered, discussed with Dr Michaell CowingGross who is coming to evaluate patient for possible chole, appreciate his input. Not septic appearing, no indication for any emergent intervention currently.   General surgery to admit. Remained hemodynamically stable.   Lyndal Pulleyaniel Elin Seats, MD 02/10/16 2033

## 2016-02-10 NOTE — ED Provider Notes (Signed)
[facility]   CSN: [id number] Arrival date & time: 02/10/16  1219     History   Chief Complaint Chief Complaint  Patient presents with  . Abdominal Pain    HPI [name] is a 46 y.o. male.  Patient is a 46 year old male with history of hypertension and known gallstones. He presents for evaluation of right upper quadrant pain. This is been ongoing for the past week. His pain is pretty much constant, however is worse when he eats and lays down to sleep at night. He denies any fevers or chills. He denies any nausea, vomiting, or diarrhea.  He tells me he was diagnosed with gallstones approximately one year ago, however had no insurance and did not follow-up with a Careers adviser. He was seen at [facility] one week ago and was told his laboratory studies were okay and discharged to home. His pain has not improved.   The history is provided by the patient.  Abdominal Pain   This is a new problem. Episode onset: 1 week. The problem occurs constantly. The problem has been gradually worsening. The pain is associated with eating. The pain is located in the RUQ. The pain is moderate. Pertinent negatives include anorexia, fever and constipation. The symptoms are aggravated by certain positions and eating. Nothing relieves the symptoms.    History reviewed. No pertinent past medical history.  There are no active problems to display for this patient.   History reviewed. No pertinent surgical history.     Home Medications    Prior to Admission medications   Medication Sig Start Date End Date Taking? Authorizing Provider  acetaminophen (TYLENOL) 500 MG tablet Take 1,000 mg by mouth every 6 (six) hours as needed for headache.    Historical Provider, MD  dicyclomine (BENTYL) 20 MG tablet Take 1 tablet (20 mg total) by mouth 2 (two) times daily. Patient not taking: Reported on 02/03/2016 12/03/14   Emilia Beck, PA-C  famotidine-calcium carbonate-magnesium  hydroxide (PEPCID COMPLETE) 10-800-165 MG chewable tablet Chew 1 tablet by mouth 3 (three) times daily before meals. 02/03/16 02/06/16  Gerhard Munch, MD  meloxicam (MOBIC) 15 MG tablet Take 1 tablet (15 mg total) by mouth daily. Patient not taking: Reported on 02/03/2016 12/03/14   Emilia Beck, PA-C  naproxen sodium (ANAPROX) 220 MG tablet Take 440 mg by mouth once.    Historical Provider, MD  omeprazole (PRILOSEC) 20 MG capsule Take 1 capsule (20 mg total) by mouth daily. Take one tablet daily 02/03/16   Gerhard Munch, MD  oxyCODONE-acetaminophen (PERCOCET/ROXICET) 5-325 MG tablet Take 1 tablet by mouth every 4 (four) hours as needed for severe pain. Patient not taking: Reported on 02/03/2016 12/01/14   Raeford Razor, MD    Family History Family History  Problem Relation Age of Onset  . Hypertension Mother   . Hypertension Maternal Grandmother   . Diabetes Maternal Grandmother   . Heart disease Paternal Grandfather   . Diabetes Paternal Grandfather     Social History Social History  Substance Use Topics  . Smoking status: Current Every Day Smoker    Packs/day: 1.50    Years: 20.00    Types: Cigarettes  . Smokeless tobacco: Not on file  . Alcohol use No     Allergies   Patient has no known allergies.   Review of Systems Review of Systems  Constitutional: Negative for fever.  Gastrointestinal: Positive for abdominal pain. Negative for anorexia and constipation.  All other systems reviewed and are  negative.    Physical Exam Updated Vital Signs BP 135/89 (BP Location: Right Arm)   Pulse 80   Temp 98 F (36.7 C) (Oral)   Resp 20   Ht 6\' 2"  (1.88 m)   Wt 230 lb (104.3 kg)   SpO2 96%   BMI 29.53 kg/m   Physical Exam  Constitutional: He is oriented to person, place, and time. He appears well-developed and well-nourished. No distress.  HENT:  Head: Normocephalic and atraumatic.  Mouth/Throat: Oropharynx is clear and moist.  Neck: Normal range of motion.  Neck supple.  Cardiovascular: Normal rate and regular rhythm.  Exam reveals no friction rub.   No murmur heard. Pulmonary/Chest: Effort normal and breath sounds normal. No respiratory distress. He has no wheezes. He has no rales.  Abdominal: Soft. Bowel sounds are normal. He exhibits no distension. There is tenderness. There is no rebound and no guarding.  There is tenderness to palpation in the right upper quadrant. Eulah PontMurphy sign is absent.  Musculoskeletal: Normal range of motion. He exhibits no edema.  Neurological: He is alert and oriented to person, place, and time. Coordination normal.  Skin: Skin is warm and dry. He is not diaphoretic.  Nursing note and vitals reviewed.    ED Treatments / Results  Labs (all labs ordered are listed, but only abnormal results are displayed) Labs Reviewed  COMPREHENSIVE METABOLIC PANEL  CBC WITH DIFFERENTIAL/PLATELET  LIPASE, BLOOD    EKG  EKG Interpretation None       Radiology No results found.  Procedures Procedures (including critical care time)  Medications Ordered in ED Medications - No data to display   Initial Impression / Assessment and Plan / ED Course  I have reviewed the triage vital signs and the nursing notes.  Pertinent labs & imaging results that were available during my care of the patient were reviewed by me and considered in my medical decision making (see chart for details).  Clinical Course     Patient presents with right upper quadrant pain with known gallstones. His last ultrasound was well over a year ago. This was repeated today and shows sludge and multiple stones along with mild gallbladder wall thickening and ductal dilatation. He has a mild white count, however normal LFTs.  I've discussed this finding with Dr. Maisie Fushomas from general surgery who is recommending [facility] where he can be evaluated in the emergency department. I have spoken with Dr. Clarice PolePfeifer who agrees to accept  the patient in transfer.  Final Clinical Impressions(s) / ED Diagnoses   Final diagnoses:  RUQ pain    New Prescriptions New Prescriptions   No medications on file     Geoffery Lyonsouglas Delayza Lungren, MD 02/10/16 1727

## 2016-02-10 NOTE — ED Triage Notes (Signed)
Pt reports abd pain since last Wednesday, was taken to [facility] ED for evaluation for abd pain n/v.  Pt given laxative for dx of constipation and was successful with having BM at home.  Pt still having generalized abd pain that is worse after eating. Denies n/v/d at this time.  Last bm yesterday.

## 2016-02-10 NOTE — H&P (Signed)
[facility]  [address] [place], [place] [address] Phone: [phone] FAX: [phone]     [name]  Jun 20, 1970 [id number]  CARE TEAM:  PCP: Eulas Post, MD  Outpatient Care Team: Patient Care Team: Eulas Post, MD as PCP - General (Family Medicine)  Inpatient Treatment Team: Treatment Team: Attending Provider: Leo Grosser, MD; Technician: Rachael Darby, NT; Registered Nurse: Jeanie Sewer, RN   This patient is a 46 y.o.male who presents today for surgical evaluation at the request of Dr Laneta Simmers.   Reason for evaluation: Cholecystitis  Smoking male has had intermittent episodes of epigastric and right upper quadrant pain.  Started October 2016.  He is been to the ER many times.  Ultrasound showed gallstones.  Suspicious to be pericolic.  He did not have insurance last year so as sedated trying to follow up with surgery.  It never happened.  Had a six month period where he was not getting attacks when he switched a low-fat diet.  However began to get attacks again.  Had a more severe attack around Christmas time.  Came emergency room.  Improved with a GI cocktail and antiacid medication.  Recommended gastroenterology follow-up.  Patient notes attacks happened again and pain has not gone away for several days.  He is trying to ride it out with just liquids.  Became unbearable.  Went to the emergency room at [place] [employer].  Pain more intense and focal.  Ultrasound showed gallstones but now with gallbladder wall thickening.  Liver function tests not elevated though.  Surgical consultation requested.  Therefore patient transferred to the hospital that has general surgeons.  Was a long hospital.  Patient restless and anxious in bed.  Wanting pain medication.  Complaining of a lot of soreness.  Has been rather nauseated but not vomiting.  No help with antiacid medications.  He has never had surgery.  No sick contacts.   Has not been draining much alcohol.  He is here with his newly would wife.  She is anxious and concerned.  No personal nor family history of GI/colon cancer, inflammatory bowel disease, irritable bowel syndrome, allergy such as Celiac Sprue, dietary/dairy problems, colitis, ulcers nor gastritis.  No recent sick contacts/gastroenteritis.  No travel outside the country.  No changes in diet.  No dysphagia to solids or liquids.  No significant heartburn or reflux.  No hematochezia, hematemesis, coffee ground emesis.  No evidence of prior gastric/peptic ulceration. He claims he can walk a mile or two without difficulty.  He does smoke about a pack and a half of cigarettes today but no history of heart attack or strokes.  No exertional chest pain.  No abdominal surgeries.    Assessment  [name]  46 y.o. male       Problem List:  Principal Problem:   Acute on chronic cholecystitis Active Problems:   Heartburn   Tobacco abuse   Anxiousness   Right upper quadrant pain and gallbladder thickening.  Constant for days now.  Very suspicious for acute on chronic calculus cholecystitis  Plan:  Admit.  IV antibiotics.  IV pain control.  Nausea control.  IV fluids.  Cholecystectomy.  Most likely can be done laparoscopically.  Was likely by my partner, Dr. Leighton Ruff, in the morning.  The anatomy & physiology of hepatobiliary & pancreatic function was discussed.  The pathophysiology of gallbladder dysfunction was discussed.  Natural history risks without surgery was discussed.  I feel the risks of no intervention will lead to serious problems that outweigh the operative risks; therefore, I recommended cholecystectomy to remove the pathology.  I explained laparoscopic techniques with possible need for an open approach.  Probable cholangiogram to evaluate the bilary tract was explained as well.    Risks such as bleeding, infection, abscess, leak, injury to other organs, need for  repair of tissues / organs, need for further treatment, stroke, heart attack, death, and other risks were discussed.  I noted a good likelihood this will help address the problem.  Possibility that this will not correct all abdominal symptoms was explained.  Goals of post-operative recovery were discussed as well.  We will work to minimize complications.  An educational handout further explaining the pathology and treatment options was given as well.  Questions were answered.  The patient expresses understanding & wishes to proceed with surgery.  STOP SMOKING!  Start nicotine patch.  We talked to the patient about the dangers of smoking.  We stressed that tobacco use dramatically increases the risk of peri-operative complications such as infection, tissue necrosis leaving to problems with incision/wound and organ healing, hernia, chronic pain, heart attack, stroke, DVT, pulmonary embolism, and death.  We noted there are programs in our community to help stop smoking.  Information was available.   -VTE prophylaxis- SCDs, etc -mobilize as tolerated to help recovery    Adin Hector, M.D., F.A.C.S. Gastrointestinal and Minimally Invasive Surgery [facility] [address], [place], [place] [address] [phone] Main / Paging   02/10/2016      Past Medical History:  Diagnosis Date  . Medical history non-contributory     Past Surgical History:  Procedure Laterality Date  . NO PAST SURGERIES      Social History   Social History  . Marital status: Married    Spouse name: N/A  . Number of children: N/A  . Years of education: N/A   Occupational History  . Not on file.   Social History Main Topics  . Smoking status: Current Every Day Smoker    Packs/day: 1.50    Years: 20.00    Types: Cigarettes  . Smokeless tobacco: Never Used  . Alcohol use No  . Drug use: No  . Sexual activity: Not on file   Other Topics Concern  . Not on file    Social History Narrative  . No narrative on file    Family History  Problem Relation Age of Onset  . Hypertension Mother   . Hypertension Maternal Grandmother   . Diabetes Maternal Grandmother   . Heart disease Paternal Grandfather   . Diabetes Paternal Grandfather     Current Facility-Administered Medications  Medication Dose Route Frequency Provider Last Rate Last Dose  . fentaNYL (SUBLIMAZE) injection 50 mcg  50 mcg Intravenous Q1H PRN Leo Grosser, MD      . Derrill Memo ON 02/11/2016] Influenza vac split quadrivalent PF (FLUARIX) injection 0.5 mL  0.5 mL Intramuscular Tomorrow-1000 Leo Grosser, MD      . Derrill Memo ON 02/11/2016] pneumococcal 23 valent vaccine (PNU-IMMUNE) injection 0.5 mL  0.5 mL Intramuscular Tomorrow-1000 Leo Grosser, MD       Current Outpatient Prescriptions  Medication Sig Dispense Refill  . acetaminophen (TYLENOL) 500 MG tablet Take 1,000 mg by mouth every 6 (six) hours as needed for headache.    . dicyclomine (BENTYL) 20 MG tablet Take 1 tablet (20 mg total) by mouth 2 (two) times daily. (  Patient not taking: Reported on 02/03/2016) 20 tablet 0  . famotidine-calcium carbonate-magnesium hydroxide (PEPCID COMPLETE) 10-800-165 MG chewable tablet Chew 1 tablet by mouth 3 (three) times daily before meals. 20 tablet 0  . meloxicam (MOBIC) 15 MG tablet Take 1 tablet (15 mg total) by mouth daily. (Patient not taking: Reported on 02/03/2016) 20 tablet 0  . naproxen sodium (ANAPROX) 220 MG tablet Take 440 mg by mouth once.    Marland Kitchen omeprazole (PRILOSEC) 20 MG capsule Take 1 capsule (20 mg total) by mouth daily. Take one tablet daily 21 capsule 0  . oxyCODONE-acetaminophen (PERCOCET/ROXICET) 5-325 MG tablet Take 1 tablet by mouth every 4 (four) hours as needed for severe pain. (Patient not taking: Reported on 02/03/2016) 12 tablet 0     No Known Allergies  ROS: Constitutional:  No fevers, chills, sweats.  Weight stable Eyes:  No vision changes, No discharge HENT:  No sore  throats, nasal drainage Lymph: No neck swelling, No bruising easily Pulmonary:  No cough, productive sputum CV: No orthopnea, PND  Patient walks 60 minutes for about 2 miles without difficulty.  No exertional chest/neck/shoulder/arm pain. GI:  No personal nor family history of GI/colon cancer, inflammatory bowel disease, irritable bowel syndrome, allergy such as Celiac Sprue, dietary/dairy problems, colitis, ulcers nor gastritis.  No recent sick contacts/gastroenteritis.  No travel outside the country.  No changes in diet. Renal: No UTIs, No hematuria Genital:  No drainage, bleeding, masses Musculoskeletal: No severe joint pain.  Good ROM major joints Skin:  No sores or lesions.  No rashes Heme/Lymph:  No easy bleeding.  No swollen lymph nodes Neuro: No focal weakness/numbness.  No seizures Psych: No suicidal ideation.  No hallucinations  BP 129/80 (BP Location: Right Arm)   Pulse 80   Temp 98.2 F (36.8 C) (Oral)   Resp 18   Ht _0  (1.88 m)   Wt 104.3 kg (230 lb)   SpO2 99%   BMI 29.53 kg/m   Physical Exam: General: Pt awake/alert/oriented x4 in moderate at first, then no acute distress Eyes: PERRL, normal EOM. Sclera nonicteric Neuro: CN II-XII intact w/o focal sensory/motor deficits. Lymph: No head/neck/groin lymphadenopathy Psych:  No delerium/psychosis/paranoia HENT: Normocephalic, Mucus membranes moist.  No thrush Neck: Supple, No tracheal deviation Chest: No pain.  Good respiratory excursion. CV:  Pulses intact.  Regular rhythm Abdomen: Soft, tenderness and right upper quadrant with obvious Murphy sign.  Rest the abdomen is soft and nontender & nondistended.  No umbilical hernia.  No diastases recti.  No incarcerated hernias. Gen:  No inguinal hernias.  No inguinal lymphadenopathy.   Ext:  SCDs BLE.  No significant edema.  No cyanosis Skin: No petechiae / purpurea.  No major sores Musculoskeletal: No severe joint pain.  Good ROM major joints   Results:    Labs: Results for orders placed or performed during the hospital encounter of 02/10/16 (from the past 48 hour(s))  Comprehensive metabolic panel     Status: None   Collection Time: 02/10/16  3:25 PM  Result Value Ref Range   Sodium 136 135 - 145 mmol/L   Potassium 4.3 3.5 - 5.1 mmol/L   Chloride 101 101 - 111 mmol/L   CO2 28 22 - 32 mmol/L   Glucose, Bld 93 65 - 99 mg/dL   BUN 19 6 - 20 mg/dL   Creatinine, Ser 0.94 0.61 - 1.24 mg/dL   Calcium 9.2 8.9 - 10.3 mg/dL   Total Protein 7.4 6.5 - 8.1 g/dL  Albumin 3.9 3.5 - 5.0 g/dL   AST 28 15 - 41 U/L   ALT 50 17 - 63 U/L   Alkaline Phosphatase 111 38 - 126 U/L   Total Bilirubin 0.6 0.3 - 1.2 mg/dL   GFR calc non Af Amer >60 >60 mL/min   GFR calc Af Amer >60 >60 mL/min    Comment: (NOTE) The eGFR has been calculated using the CKD EPI equation. This calculation has not been validated in all clinical situations. eGFR's persistently <60 mL/min signify possible Chronic Kidney Disease.    Anion gap 7 5 - 15  CBC with Differential     Status: Abnormal   Collection Time: 02/10/16  3:25 PM  Result Value Ref Range   WBC 10.7 (H) 4.0 - 10.5 K/uL   RBC 4.86 4.22 - 5.81 MIL/uL   Hemoglobin 14.5 13.0 - 17.0 g/dL   HCT 43.5 39.0 - 52.0 %   MCV 89.5 78.0 - 100.0 fL   MCH 29.8 26.0 - 34.0 pg   MCHC 33.3 30.0 - 36.0 g/dL   RDW 12.9 11.5 - 15.5 %   Platelets 225 150 - 400 K/uL   Neutrophils Relative % 71 %   Neutro Abs 7.6 1.7 - 7.7 K/uL   Lymphocytes Relative 19 %   Lymphs Abs 2.0 0.7 - 4.0 K/uL   Monocytes Relative 9 %   Monocytes Absolute 1.0 0.1 - 1.0 K/uL   Eosinophils Relative 1 %   Eosinophils Absolute 0.1 0.0 - 0.7 K/uL   Basophils Relative 0 %   Basophils Absolute 0.0 0.0 - 0.1 K/uL  Lipase, blood     Status: None   Collection Time: 02/10/16  3:25 PM  Result Value Ref Range   Lipase 19 11 - 51 U/L    Imaging / Studies: US Abdomen Limited Ruq  Result Date: 02/10/2016 CLINICAL DATA:  Right upper quadrant pain. EXAM: US  ABDOMEN LIMITED - RIGHT UPPER QUADRANT COMPARISON:  Ultrasound 12/01/2014. FINDINGS: Gallbladder: Sludge and multiple gallstones noted. Largest gallstone measures 1.5 cm. 6 mm mobile stone noted in the gallbladder neck. Gallbladder wall thickening to 4 mm noted. Common bile duct: Diameter: 4 mm. Liver: There is echogenic consistent fatty infiltration and/or hepatocellular disease. Mild intrahepatic biliary ductal dilatation noted. IMPRESSION: 1. Sludge and multiple gallstones noted. The largest gallstone measures 1.5 cm. A 6 mm immobile gallstone is noted in the gallbladder neck. Gallbladder wall thickening to 4 mm noted. Cholecystitis cannot be excluded. 2. Common bile duct is normal caliber 4 mm. Mild intrahepatic ductal dilatation. MRCP can be obtained for further evaluation. Electronically Signed   By: Marcello Moores  Register   On: 02/10/2016 15:55    Medications / Allergies: per chart  Antibiotics: Anti-infectives    None        Note: Portions of this report may have been transcribed using voice recognition software. Every effort was made to ensure accuracy; however, inadvertent computerized transcription errors may be present.   Any transcriptional errors that result from this process are unintentional.    Adin Hector, M.D., F.A.C.S. Gastrointestinal and Minimally Invasive Surgery [facility] [address] [place], [place] [address] [phone] Main / Paging   02/10/2016

## 2016-02-10 NOTE — ED Notes (Signed)
Patient transported to Ultrasound 

## 2016-02-11 ENCOUNTER — Inpatient Hospital Stay (HOSPITAL_COMMUNITY): Payer: BLUE CROSS/BLUE SHIELD

## 2016-02-11 ENCOUNTER — Inpatient Hospital Stay (HOSPITAL_COMMUNITY): Payer: BLUE CROSS/BLUE SHIELD | Admitting: Certified Registered Nurse Anesthetist

## 2016-02-11 ENCOUNTER — Encounter (HOSPITAL_COMMUNITY): Admission: EM | Disposition: A | Discharge status: Home Health | Payer: Self-pay | Source: Home / Self Care

## 2016-02-11 HISTORY — PX: CHOLECYSTECTOMY: SHX55

## 2016-02-11 LAB — SURGICAL PCR SCREEN
MRSA, PCR: NEGATIVE
STAPHYLOCOCCUS AUREUS: POSITIVE — AB

## 2016-02-11 SURGERY — LAPAROSCOPIC CHOLECYSTECTOMY WITH INTRAOPERATIVE CHOLANGIOGRAM
Anesthesia: General | Site: Abdomen

## 2016-02-11 MED ORDER — GABAPENTIN 300 MG PO CAPS
300.0000 mg | ORAL_CAPSULE | ORAL | Status: AC
  Administered 2016-02-11: 300 mg via ORAL
  Filled 2016-02-11 (×2): qty 1

## 2016-02-11 MED ORDER — ROCURONIUM BROMIDE 50 MG/5ML IV SOSY
PREFILLED_SYRINGE | INTRAVENOUS | Status: AC
Start: 2016-02-11 — End: 2016-02-11
  Filled 2016-02-11: qty 5

## 2016-02-11 MED ORDER — LACTATED RINGERS IV SOLN
INTRAVENOUS | Status: DC
  Administered 2016-02-11 – 2016-02-14 (×6): via INTRAVENOUS

## 2016-02-11 MED ORDER — SUGAMMADEX SODIUM 200 MG/2ML IV SOLN
INTRAVENOUS | Status: AC
  Filled 2016-02-11: qty 2

## 2016-02-11 MED ORDER — SUGAMMADEX SODIUM 500 MG/5ML IV SOLN
INTRAVENOUS | Status: AC
  Filled 2016-02-11: qty 5

## 2016-02-11 MED ORDER — DEXAMETHASONE SODIUM PHOSPHATE 10 MG/ML IJ SOLN
INTRAMUSCULAR | Status: AC
  Filled 2016-02-11: qty 1

## 2016-02-11 MED ORDER — INFLUENZA VAC SPLIT QUAD 0.5 ML IM SUSY
0.5000 mL | PREFILLED_SYRINGE | Freq: Once | INTRAMUSCULAR | Status: AC
  Administered 2016-02-11: 0.5 mL via INTRAMUSCULAR
  Filled 2016-02-11: qty 0.5

## 2016-02-11 MED ORDER — CELECOXIB 200 MG PO CAPS
400.0000 mg | ORAL_CAPSULE | ORAL | Status: AC
  Administered 2016-02-11: 400 mg via ORAL
  Filled 2016-02-11: qty 2

## 2016-02-11 MED ORDER — MORPHINE SULFATE (PF) 10 MG/ML IV SOLN: 2.0000 mg | INTRAVENOUS | Status: DC | PRN

## 2016-02-11 MED ORDER — ROCURONIUM BROMIDE 50 MG/5ML IV SOSY
PREFILLED_SYRINGE | INTRAVENOUS | Status: AC
  Filled 2016-02-11: qty 5

## 2016-02-11 MED ORDER — HYDROMORPHONE HCL 2 MG/ML IJ SOLN
INTRAMUSCULAR | Status: AC
  Filled 2016-02-11: qty 1

## 2016-02-11 MED ORDER — LACTATED RINGERS IR SOLN
Status: DC | PRN
  Administered 2016-02-11: 3000 mL

## 2016-02-11 MED ORDER — BUPIVACAINE HCL (PF) 0.25 % IJ SOLN
INTRAMUSCULAR | Status: AC
  Filled 2016-02-11: qty 30

## 2016-02-11 MED ORDER — HYDROMORPHONE HCL 1 MG/ML IJ SOLN
INTRAMUSCULAR | Status: DC | PRN
  Administered 2016-02-11: 0.5 mg via INTRAVENOUS
  Administered 2016-02-11: 1 mg via INTRAVENOUS
  Administered 2016-02-11: 0.5 mg via INTRAVENOUS

## 2016-02-11 MED ORDER — PROPOFOL 10 MG/ML IV BOLUS
INTRAVENOUS | Status: DC | PRN
  Administered 2016-02-11: 150 mg via INTRAVENOUS

## 2016-02-11 MED ORDER — IOPAMIDOL (ISOVUE-300) INJECTION 61%
INTRAVENOUS | Status: AC
  Filled 2016-02-11: qty 50

## 2016-02-11 MED ORDER — SUGAMMADEX SODIUM 200 MG/2ML IV SOLN
INTRAVENOUS | Status: DC | PRN
  Administered 2016-02-11: 300 mg via INTRAVENOUS

## 2016-02-11 MED ORDER — LIDOCAINE 2% (20 MG/ML) 5 ML SYRINGE
INTRAMUSCULAR | Status: DC | PRN
Start: 2016-02-11 — End: 2016-02-11
  Administered 2016-02-11: 100 mg via INTRAVENOUS

## 2016-02-11 MED ORDER — MIDAZOLAM HCL 5 MG/5ML IJ SOLN
INTRAMUSCULAR | Status: DC | PRN
  Administered 2016-02-11 (×2): 1 mg via INTRAVENOUS

## 2016-02-11 MED ORDER — ESMOLOL HCL 100 MG/10ML IV SOLN
INTRAVENOUS | Status: AC
  Filled 2016-02-11: qty 10

## 2016-02-11 MED ORDER — BUPIVACAINE HCL (PF) 0.25 % IJ SOLN
INTRAMUSCULAR | Status: DC | PRN
  Administered 2016-02-11: 30 mL

## 2016-02-11 MED ORDER — LABETALOL HCL 5 MG/ML IV SOLN
INTRAVENOUS | Status: DC | PRN
  Administered 2016-02-11: 2.5 mg via INTRAVENOUS

## 2016-02-11 MED ORDER — ONDANSETRON HCL 4 MG/2ML IJ SOLN
INTRAMUSCULAR | Status: AC
  Filled 2016-02-11: qty 2

## 2016-02-11 MED ORDER — ESMOLOL HCL 100 MG/10ML IV SOLN
INTRAVENOUS | Status: DC | PRN
  Administered 2016-02-11: 20 mg via INTRAVENOUS

## 2016-02-11 MED ORDER — HYDROMORPHONE HCL 1 MG/ML IJ SOLN
INTRAMUSCULAR | Status: AC
Start: 2016-02-11 — End: 2016-02-12
  Filled 2016-02-11: qty 1

## 2016-02-11 MED ORDER — MIDAZOLAM HCL 2 MG/2ML IJ SOLN
INTRAMUSCULAR | Status: AC
  Filled 2016-02-11: qty 2

## 2016-02-11 MED ORDER — FENTANYL CITRATE (PF) 100 MCG/2ML IJ SOLN
INTRAMUSCULAR | Status: AC
  Filled 2016-02-11: qty 2

## 2016-02-11 MED ORDER — ROCURONIUM BROMIDE 50 MG/5ML IV SOSY
PREFILLED_SYRINGE | INTRAVENOUS | Status: DC | PRN
  Administered 2016-02-11: 15 mg via INTRAVENOUS
  Administered 2016-02-11: 10 mg via INTRAVENOUS
  Administered 2016-02-11 (×2): 20 mg via INTRAVENOUS
  Administered 2016-02-11: 10 mg via INTRAVENOUS
  Administered 2016-02-11: 35 mg via INTRAVENOUS
  Administered 2016-02-11: 10 mg via INTRAVENOUS

## 2016-02-11 MED ORDER — LIDOCAINE 2% (20 MG/ML) 5 ML SYRINGE
INTRAMUSCULAR | Status: AC
  Filled 2016-02-11: qty 5

## 2016-02-11 MED ORDER — FENTANYL CITRATE (PF) 100 MCG/2ML IJ SOLN
INTRAMUSCULAR | Status: AC
  Filled 2016-02-11: qty 4

## 2016-02-11 MED ORDER — SUCCINYLCHOLINE CHLORIDE 200 MG/10ML IV SOSY
PREFILLED_SYRINGE | INTRAVENOUS | Status: DC | PRN
  Administered 2016-02-11: 100 mg via INTRAVENOUS

## 2016-02-11 MED ORDER — DEXAMETHASONE SODIUM PHOSPHATE 10 MG/ML IJ SOLN
INTRAMUSCULAR | Status: DC | PRN
  Administered 2016-02-11: 10 mg via INTRAVENOUS

## 2016-02-11 MED ORDER — ACETAMINOPHEN 500 MG PO TABS
1000.0000 mg | ORAL_TABLET | ORAL | Status: AC
  Administered 2016-02-11: 1000 mg via ORAL
  Filled 2016-02-11: qty 2

## 2016-02-11 MED ORDER — HYDROMORPHONE HCL 1 MG/ML IJ SOLN
0.2500 mg | INTRAMUSCULAR | Status: DC | PRN
  Administered 2016-02-11 (×2): 0.5 mg via INTRAVENOUS

## 2016-02-11 MED ORDER — 0.9 % SODIUM CHLORIDE (POUR BTL) OPTIME
TOPICAL | Status: DC | PRN
  Administered 2016-02-11: 1000 mL

## 2016-02-11 MED ORDER — ONDANSETRON HCL 4 MG/2ML IJ SOLN
INTRAMUSCULAR | Status: DC | PRN
  Administered 2016-02-11: 4 mg via INTRAVENOUS

## 2016-02-11 MED ORDER — PROPOFOL 10 MG/ML IV BOLUS
INTRAVENOUS | Status: AC
  Filled 2016-02-11: qty 20

## 2016-02-11 MED ORDER — FENTANYL CITRATE (PF) 100 MCG/2ML IJ SOLN
INTRAMUSCULAR | Status: DC | PRN
  Administered 2016-02-11 (×6): 50 ug via INTRAVENOUS

## 2016-02-11 MED ORDER — LABETALOL HCL 5 MG/ML IV SOLN: INTRAVENOUS | Status: DC | PRN

## 2016-02-11 MED ORDER — SUCCINYLCHOLINE CHLORIDE 200 MG/10ML IV SOSY
PREFILLED_SYRINGE | INTRAVENOUS | Status: AC
  Filled 2016-02-11: qty 10

## 2016-02-11 MED ORDER — LABETALOL HCL 5 MG/ML IV SOLN
INTRAVENOUS | Status: AC
  Filled 2016-02-11: qty 4

## 2016-02-11 SURGICAL SUPPLY — 40 items
APPLIER CLIP 5 13 M/L LIGAMAX5 (MISCELLANEOUS) ×6
CABLE HIGH FREQUENCY MONO STRZ (ELECTRODE) ×3 IMPLANT
CHLORAPREP W/TINT 26ML (MISCELLANEOUS) ×3 IMPLANT
CLIP APPLIE 5 13 M/L LIGAMAX5 (MISCELLANEOUS) ×2 IMPLANT
COVER MAYO STAND STRL (DRAPES) ×3 IMPLANT
COVER SURGICAL LIGHT HANDLE (MISCELLANEOUS) ×3 IMPLANT
DERMABOND ADVANCED (GAUZE/BANDAGES/DRESSINGS) ×2
DERMABOND ADVANCED .7 DNX12 (GAUZE/BANDAGES/DRESSINGS) ×1 IMPLANT
DRAIN CHANNEL RND F F (WOUND CARE) ×3 IMPLANT
DRAPE C-ARM 42X120 X-RAY (DRAPES) ×3 IMPLANT
ELECT REM PT RETURN 9FT ADLT (ELECTROSURGICAL) ×3
ELECTRODE REM PT RTRN 9FT ADLT (ELECTROSURGICAL) ×1 IMPLANT
EVACUATOR 1/8 PVC DRAIN (DRAIN) ×3 IMPLANT
GLOVE BIO SURGEON STRL SZ 6.5 (GLOVE) ×2 IMPLANT
GLOVE BIO SURGEONS STRL SZ 6.5 (GLOVE) ×1
GLOVE BIOGEL PI IND STRL 7.0 (GLOVE) ×1 IMPLANT
GLOVE BIOGEL PI INDICATOR 7.0 (GLOVE) ×2
GOWN STRL REUS W/TWL 2XL LVL3 (GOWN DISPOSABLE) ×3 IMPLANT
GOWN STRL REUS W/TWL XL LVL3 (GOWN DISPOSABLE) ×12 IMPLANT
IRRIG SUCT STRYKERFLOW 2 WTIP (MISCELLANEOUS) ×3
IRRIGATION SUCT STRKRFLW 2 WTP (MISCELLANEOUS) ×1 IMPLANT
IV CATH 14GX2 1/4 (CATHETERS) ×3 IMPLANT
KIT BASIN OR (CUSTOM PROCEDURE TRAY) ×3 IMPLANT
POUCH SPECIMEN RETRIEVAL 10MM (ENDOMECHANICALS) ×3 IMPLANT
SCISSORS LAP 5X35 DISP (ENDOMECHANICALS) ×3 IMPLANT
SET CHOLANGIOGRAPH MIX (MISCELLANEOUS) ×3 IMPLANT
SET IRRIG TUBING LAPAROSCOPIC (IRRIGATION / IRRIGATOR) ×3 IMPLANT
SPONGE DRAIN TRACH 4X4 STRL 2S (GAUZE/BANDAGES/DRESSINGS) ×3 IMPLANT
SUT PROLENE 3 0 SH 48 (SUTURE) ×6 IMPLANT
SUT VIC AB 2-0 SH 27 (SUTURE) ×2
SUT VIC AB 2-0 SH 27X BRD (SUTURE) ×1 IMPLANT
SUT VIC AB 4-0 PS2 18 (SUTURE) ×3 IMPLANT
TAPE CLOTH SURG 4X10 WHT LF (GAUZE/BANDAGES/DRESSINGS) ×3 IMPLANT
TOWEL OR 17X26 10 PK STRL BLUE (TOWEL DISPOSABLE) ×3 IMPLANT
TOWEL OR NON WOVEN STRL DISP B (DISPOSABLE) ×3 IMPLANT
TRAY LAPAROSCOPIC (CUSTOM PROCEDURE TRAY) ×3 IMPLANT
TROCAR BLADELESS OPT 12M 100M (ENDOMECHANICALS) IMPLANT
TROCAR XCEL BLUNT TIP 100MML (ENDOMECHANICALS) ×3 IMPLANT
TROCAR XCEL NON-BLD 11X100MML (ENDOMECHANICALS) ×3 IMPLANT
TUBING INSUF HEATED (TUBING) ×3 IMPLANT

## 2016-02-11 NOTE — Anesthesia Preprocedure Evaluation (Signed)
Anesthesia Evaluation  Patient identified by MRN, date of birth, ID band Patient awake    Reviewed: Allergy & Precautions, H&P , Patient's Chart, lab work & pertinent test results, reviewed documented beta blocker date and time   Airway Mallampati: II  TM Distance: >3 FB Neck ROM: full    Dental no notable dental hx.    Pulmonary Current Smoker,    Pulmonary exam normal breath sounds clear to auscultation       Cardiovascular  Rhythm:regular Rate:Normal     Neuro/Psych    GI/Hepatic   Endo/Other    Renal/GU      Musculoskeletal   Abdominal   Peds  Hematology   Anesthesia Other Findings   Reproductive/Obstetrics                             Anesthesia Physical Anesthesia Plan  ASA: II  Anesthesia Plan: General   Post-op Pain Management:    Induction: Intravenous  Airway Management Planned: Oral ETT  Additional Equipment:   Intra-op Plan:   Post-operative Plan: Extubation in OR  Informed Consent: I have reviewed the patients History and Physical, chart, labs and discussed the procedure including the risks, benefits and alternatives for the proposed anesthesia with the patient or authorized representative who has indicated his/her understanding and acceptance.   Dental Advisory Given and Dental advisory given  Plan Discussed with: CRNA and Surgeon  Anesthesia Plan Comments: (  Discussed general anesthesia, including possible nausea, instrumentation of airway, sore throat,pulmonary aspiration, etc. I asked if the were any outstanding questions, or  concerns before we proceeded. )        Anesthesia Quick Evaluation  

## 2016-02-11 NOTE — Progress Notes (Signed)
Acute on chronic cholecystitis  Subjective: Pain in RUQ  Objective: Vital signs in last 24 hours: Temp:  [97.3 F (36.3 C)-99.3 F (37.4 C)] 99.3 F (37.4 C) (01/04 0530) Pulse Rate:  [76-94] 91 (01/04 0530) Resp:  [18-20] 18 (01/04 0530) BP: (129-146)/(80-97) 141/83 (01/04 0530) SpO2:  [96 %-100 %] 97 % (01/04 0530) Weight:  [104.3 kg (230 lb)] 104.3 kg (230 lb) (01/03 1228) Last BM Date: 02/10/16  Intake/Output from previous day: 01/03 0701 - 01/04 0700 In: 695 [I.V.:695] Out: -  Intake/Output this shift: No intake/output data recorded.  General appearance: alert and cooperative Resp: no distress GI: soft, ttp in RUQ  Lab Results:  Results for orders placed or performed during the hospital encounter of 02/10/16 (from the past 24 hour(s))  Comprehensive metabolic panel     Status: None   Collection Time: 02/10/16  3:25 PM  Result Value Ref Range   Sodium 136 135 - 145 mmol/L   Potassium 4.3 3.5 - 5.1 mmol/L   Chloride 101 101 - 111 mmol/L   CO2 28 22 - 32 mmol/L   Glucose, Bld 93 65 - 99 mg/dL   BUN 19 6 - 20 mg/dL   Creatinine, Ser 1.610.94 0.61 - 1.24 mg/dL   Calcium 9.2 8.9 - 09.610.3 mg/dL   Total Protein 7.4 6.5 - 8.1 g/dL   Albumin 3.9 3.5 - 5.0 g/dL   AST 28 15 - 41 U/L   ALT 50 17 - 63 U/L   Alkaline Phosphatase 111 38 - 126 U/L   Total Bilirubin 0.6 0.3 - 1.2 mg/dL   GFR calc non Af Amer >60 >60 mL/min   GFR calc Af Amer >60 >60 mL/min   Anion gap 7 5 - 15  CBC with Differential     Status: Abnormal   Collection Time: 02/10/16  3:25 PM  Result Value Ref Range   WBC 10.7 (H) 4.0 - 10.5 K/uL   RBC 4.86 4.22 - 5.81 MIL/uL   Hemoglobin 14.5 13.0 - 17.0 g/dL   HCT 04.543.5 40.939.0 - 81.152.0 %   MCV 89.5 78.0 - 100.0 fL   MCH 29.8 26.0 - 34.0 pg   MCHC 33.3 30.0 - 36.0 g/dL   RDW 91.412.9 78.211.5 - 95.615.5 %   Platelets 225 150 - 400 K/uL   Neutrophils Relative % 71 %   Neutro Abs 7.6 1.7 - 7.7 K/uL   Lymphocytes Relative 19 %   Lymphs Abs 2.0 0.7 - 4.0 K/uL   Monocytes  Relative 9 %   Monocytes Absolute 1.0 0.1 - 1.0 K/uL   Eosinophils Relative 1 %   Eosinophils Absolute 0.1 0.0 - 0.7 K/uL   Basophils Relative 0 %   Basophils Absolute 0.0 0.0 - 0.1 K/uL  Lipase, blood     Status: None   Collection Time: 02/10/16  3:25 PM  Result Value Ref Range   Lipase 19 11 - 51 U/L  Surgical pcr screen     Status: Abnormal   Collection Time: 02/10/16 11:41 PM  Result Value Ref Range   MRSA, PCR NEGATIVE NEGATIVE   Staphylococcus aureus POSITIVE (A) NEGATIVE     Studies/Results Radiology     MEDS, Scheduled . acetaminophen  1,000 mg Oral Q8H  . acetaminophen  1,000 mg Oral On Call to OR  . cefTRIAXone (ROCEPHIN)  IV  2 g Intravenous Q24H  . celecoxib  400 mg Oral On Call to OR  . Chlorhexidine Gluconate Cloth  6 each Topical Once  .  enoxaparin (LOVENOX) injection  40 mg Subcutaneous Q24H  . gabapentin  300 mg Oral On Call to OR  . Influenza vac split quadrivalent PF  0.5 mL Intramuscular Tomorrow-1000  . lip balm  1 application Topical BID  . nicotine  14 mg Transdermal Daily  . pneumococcal 23 valent vaccine  0.5 mL Intramuscular Tomorrow-1000  . vitamin C  500 mg Oral BID     Assessment: Acute on chronic cholecystitis   Plan: OR today.  The anatomy & physiology of hepatobiliary & pancreatic function was discussed.  The pathophysiology of gallbladder dysfunction was discussed.  Natural history risks without surgery was discussed.   I feel the risks of no intervention will lead to serious problems that outweigh the operative risks; therefore, I recommended cholecystectomy to remove the pathology.  I explained laparoscopic techniques with possible need for an open approach.  Probable cholangiogram to evaluate the bilary tract was explained as well.    Risks such as bleeding, infection, abscess, leak, injury to other organs, need for further treatment, heart attack, death, and other risks were discussed.  I noted a good likelihood this will help  address the problem.  Possibility that this will not correct all abdominal symptoms was explained.  Goals of post-operative recovery were discussed as well.  We will work to minimize complications.  Questions were answered.  The patient expresses understanding & wishes to proceed with surgery.   LOS: 1 day    Vanita Panda, MD [facility], [place] [phone]   02/11/2016 7:51 AM

## 2016-02-11 NOTE — Anesthesia Procedure Notes (Addendum)
Procedure Name: Intubation Date/Time: 02/11/2016 11:26 AM Performed by: Maxwell Caul Pre-anesthesia Checklist: Patient identified, Emergency Drugs available, Suction available and Patient being monitored Patient Re-evaluated:Patient Re-evaluated prior to inductionOxygen Delivery Method: Circle system utilized Preoxygenation: Pre-oxygenation with 100% oxygen Intubation Type: IV induction Ventilation: Mask ventilation without difficulty Laryngoscope Size: Mac and 4 Grade View: Grade I Tube type: Oral Tube size: 7.5 mm Number of attempts: 1 Airway Equipment and Method: Stylet Placement Confirmation: ETT inserted through vocal cords under direct vision,  positive ETCO2 and breath sounds checked- equal and bilateral Secured at: 21 cm Tube secured with: Tape Dental Injury: Teeth and Oropharynx as per pre-operative assessment

## 2016-02-11 NOTE — Transfer of Care (Signed)
Immediate Anesthesia Transfer of Care Note  Patient: [name]  Procedure(s) Performed: Procedure(s): LAPAROSCOPIC CHOLECYSTECTOMY (N/A)  Patient Location: PACU  Anesthesia Type:General  Level of Consciousness:  sedated, patient cooperative and responds to stimulation  Airway & Oxygen Therapy:Patient Spontanous Breathing and Patient connected to face mask oxgen  Post-op Assessment:  Report given to PACU RN and Post -op Vital signs reviewed and stable  Post vital signs:  Reviewed and stable  Last Vitals:  Vitals:   02/11/16 1100 02/11/16 1435  BP:  (!) 159/95  Pulse: 92 (!) 108  Resp:  20  Temp:  37.6 C    Complications: No apparent anesthesia complications

## 2016-02-11 NOTE — Op Note (Signed)
02/10/2016 - 02/11/2016  2:27 PM  PATIENT:  Carlos DarkJulian Scott Buller  46 y.o. male  Patient Care Team: Kristian CoveyBruce W Burchette, MD as PCP - General (Family Medicine)  PRE-OPERATIVE DIAGNOSIS:  cholecystitis  POST-OPERATIVE DIAGNOSIS:  cholecystitis, perforated gallbladder  PROCEDURE:   LAPAROSCOPIC CHOLECYSTECTOMY    Surgeon(s): Romie LeveeAlicia Jaleesa Cervi, MD  ASSISTANT: none   ANESTHESIA:   local and general  EBL:  Total I/O In: 1000 [I.V.:1000] Out: 100 [Urine:50; Blood:50]  DRAINS: (51F) Jackson-Pratt drain(s) with closed bulb suction in the RUQ   SPECIMEN:  Source of Specimen:  gallbladder  DISPOSITION OF SPECIMEN:  PATHOLOGY  COUNTS:  YES  PLAN OF CARE: Pt already admitted  PATIENT DISPOSITION:  PACU - hemodynamically stable.  INDICATION: 46 year old male with 5 days of right upper quadrant pain. Right upper quadrant ultrasound shows gallbladder wall thickening concerning for cholecystitis  The anatomy & physiology of hepatobiliary & pancreatic function was discussed.  The pathophysiology of gallbladder dysfunction was discussed.  Natural history risks without surgery was discussed.   I feel the risks of no intervention will lead to serious problems that outweigh the operative risks; therefore, I recommended cholecystectomy to remove the pathology.  I explained laparoscopic techniques with possible need for an open approach.  Probable cholangiogram to evaluate the bilary tract was explained as well.    Risks such as bleeding, infection, abscess, leak, injury to other organs, need for further treatment, heart attack, death, and other risks were discussed.  I noted a good likelihood this will help address the problem.  Possibility that this will not correct all abdominal symptoms was explained.  Goals of post-operative recovery were discussed as well.    OR FINDINGS: Gallbladder ruptured laterally into the retroperitoneum with biliary leak into the right upper quadrant  DESCRIPTION:   The  patient was identified & brought into the operating room. The patient was positioned supine with arms tucked. SCDs were active during the entire case. The patient underwent general anesthesia without any difficulty.  The abdomen was prepped and draped in a sterile fashion. A Surgical Timeout was performed and confirmed our plan.  We positioned the patient in reverse [place] & right side up.  I placed a Hassan laparoscopic port through the umbilicus using open entry technique.  Entry was clean. There were no adhesions to the anterior abdominal wall supraumbilically.  We induced carbon dioxide insufflation. Camera inspection revealed no injury.    I proceeded to continue with laparoscopic technique. I placed a 11 mm port in mid subcostal region, another 5mm port in the right flank near the anterior axillary line, and a 5mm port in the left subxiphoid region obliquely within the falciform ligament.  I turned attention to the right upper quadrant.  The omentum was densely adherent to the gallbladder. This was taken down using blunt dissection and electrocautery to control hemostasis. I came around the gallbladder laterally and freed off the omentum.  The gallbladder fundus was elevated cephalad. I then encountered a large area of gallstones. A stone extractor was used to remove these from the abdomen. There was a ruptured site in the gallbladder on the lateral edge. I continued to mobilize laterally until I got down to the neck of the gallbladder. This was very friable and at some point it appears it disconnected from the cystic duct. I continued to gradually mobilize tissue and divide along the edge of the gallbladder. I never divided a structure with bilious contents.  I did identify the cystic artery and placed  2 clips on this and transected it with scissors. I carefully and bluntly dissected away the surrounding tissue from the neck of the gallbladder. During this time a small pinhole-sized bile leak was  identified where we were dissecting. I tried to place a catheter through this leak to obtain a cholangiogram but I was unable to do so. I was concerned that this might be the common bile duct and therefore I did not enlarge the hole. I decided not to perform a cholangiogram. I continued to mobilize the gallbladder off of the gallbladder fossae using electrocautery. Hemostasis was achieved in the liver bed as we went. I decided to placed a 3-0 Prolene suture over the small pinhole in one of the biliary structures given that I could not safely identify it as common bile duct or cystic duct. I placed a shallow suture to close tissue over top of this leak using laparoscopic needle driver and a 3-0 Prolene suture. This was tied down and a figure-of-eight manner and no more bile leaked from the site.  I then finished removing the gallbladder from the gallbladder fossa using electrocautery. This was placed in a laparoscopic bag and brought out through the umbilicus. I inspected the gallbladder on the back table and it appears that the neck of the gallbladder was separated from the cystic duct at some point during the surgery.  I irrigated the entire right upper quadrant with normal saline. I removed any remaining stones from the right upper quadrant that I can visualize. Hemostasis was good. A 19 [place] Blake drain was placed in the right upper quadrant and brought out through one of the trocar sites. This was secured in place with a 3-0 Prolene suture. I then desufflated the abdomen and removed the remaining ports.   I closed the umbilical fascia using 0 Vicryl stitches.   I closed the skin using 4-0 vicryl stitch.  Sterile dressings were applied. The patient was extubated & arrived in the PACU in stable condition.  I had discussed postoperative care with the patient in the holding area.   I will discuss  operative findings and postoperative goals / instructions with the patient's family.  Instructions are written in  the chart as well.

## 2016-02-11 NOTE — Progress Notes (Signed)
Called to patients room -states -I cant breath. Respirations rapid 26 -28  BP 170/101 O2 sat 98 %  P=98. Patient states my stomach wont move. abd is rising and falling with each breath..stayed with patient -encouraged him to slow his breathing  And monitored o2 sat which remained 97 and above.Bp 165/100 P90. Episode lasted 12 minutes./ patient remained warm and dry. Patient denies ever having a panic attack. Stated was feeling better before  I left the room. Bp160/100 p=90  o2 sat 99

## 2016-02-12 ENCOUNTER — Encounter (HOSPITAL_COMMUNITY): Payer: Self-pay | Admitting: General Surgery

## 2016-02-12 LAB — CBC
HCT: 38.3 % — ABNORMAL LOW (ref 39.0–52.0)
Hemoglobin: 13 g/dL (ref 13.0–17.0)
MCH: 30 pg (ref 26.0–34.0)
MCHC: 33.9 g/dL (ref 30.0–36.0)
MCV: 88.5 fL (ref 78.0–100.0)
PLATELETS: 226 10*3/uL (ref 150–400)
RBC: 4.33 MIL/uL (ref 4.22–5.81)
RDW: 12.9 % (ref 11.5–15.5)
WBC: 19.7 10*3/uL — ABNORMAL HIGH (ref 4.0–10.5)

## 2016-02-12 LAB — BILIRUBIN, TOTAL: BILIRUBIN TOTAL: 0.4 mg/dL (ref 0.3–1.2)

## 2016-02-12 MED ORDER — MUPIROCIN 2 % EX OINT
1.0000 "application " | TOPICAL_OINTMENT | Freq: Two times a day (BID) | CUTANEOUS | Status: DC
  Administered 2016-02-12 – 2016-02-16 (×8): 1 via NASAL
  Filled 2016-02-12 (×2): qty 22

## 2016-02-12 MED ORDER — CHLORHEXIDINE GLUCONATE CLOTH 2 % EX PADS
6.0000 | MEDICATED_PAD | Freq: Every day | CUTANEOUS | Status: AC
  Administered 2016-02-12 – 2016-02-16 (×5): 6 via TOPICAL

## 2016-02-12 NOTE — Progress Notes (Signed)
Patient ID: [name], male   DOB: 09/30/1970, 46 y.o.   MRN: [id number]  [facility] Progress Note  1 Day Post-Op  Subjective: Patient states that he feels much better today. Abdomen is sore, but not painful. Denies n/v. Tolerating clear liquids. He has already ambulated several laps and is passing flatus.  Objective: Vital signs in last 24 hours: Temp:  [97.7 F (36.5 C)-99.6 F (37.6 C)] 98.3 F (36.8 C) (01/05 0530) Pulse Rate:  [82-108] 95 (01/05 0530) Resp:  [16-28] 16 (01/05 0530) BP: (117-173)/(74-101) 145/85 (01/05 0530) SpO2:  [94 %-100 %] 99 % (01/05 0530) Last BM Date: 02/10/16  Intake/Output from previous day: 01/04 0701 - 01/05 0700 In: 3230.8 [P.O.:540; I.V.:2590.8; IV Piggyback:100] Out: 1665 [Urine:975; Drains:640; Blood:50] Intake/Output this shift: No intake/output data recorded.  PE: Gen:  Alert, NAD, pleasant Pulm:  CTAB, no W/R/R, effort normal Abd: Soft, ND, +BS, appropriately tender, incisions C/D/I, drain with serosanguinous drainage  Lab Results:   Recent Labs  02/10/16 1525 02/12/16 0456  WBC 10.7* 19.7*  HGB 14.5 13.0  HCT 43.5 38.3*  PLT 225 226   BMET  Recent Labs  02/10/16 1525  NA 136  K 4.3  CL 101  CO2 28  GLUCOSE 93  BUN 19  CREATININE 0.94  CALCIUM 9.2   PT/INR No results for input(s): LABPROT, INR in the last 72 hours. CMP     Component Value Date/Time   NA 136 02/10/2016 1525   K 4.3 02/10/2016 1525   CL 101 02/10/2016 1525   CO2 28 02/10/2016 1525   GLUCOSE 93 02/10/2016 1525   BUN 19 02/10/2016 1525   CREATININE 0.94 02/10/2016 1525   CALCIUM 9.2 02/10/2016 1525   PROT 7.4 02/10/2016 1525   ALBUMIN 3.9 02/10/2016 1525   AST 28 02/10/2016 1525   ALT 50 02/10/2016 1525   ALKPHOS 111 02/10/2016 1525   BILITOT 0.4 02/12/2016 0456   GFRNONAA >60 02/10/2016 1525   GFRAA >60 02/10/2016 1525   Lipase     Component Value Date/Time   LIPASE 19 02/10/2016 1525        Studies/Results: [place] Abdomen Limited Ruq  Result Date: 02/10/2016 CLINICAL DATA:  Right upper quadrant pain. EXAM: US ABDOMEN LIMITED - RIGHT UPPER QUADRANT COMPARISON:  Ultrasound 12/01/2014. FINDINGS: Gallbladder: Sludge and multiple gallstones noted. Largest gallstone measures 1.5 cm. 6 mm mobile stone noted in the gallbladder neck. Gallbladder wall thickening to 4 mm noted. Common bile duct: Diameter: 4 mm. Liver: There is echogenic consistent fatty infiltration and/or hepatocellular disease. Mild intrahepatic biliary ductal dilatation noted. IMPRESSION: 1. Sludge and multiple gallstones noted. The largest gallstone measures 1.5 cm. A 6 mm immobile gallstone is noted in the gallbladder neck. Gallbladder wall thickening to 4 mm noted. Cholecystitis cannot be excluded. 2. Common bile duct is normal caliber 4 mm. Mild intrahepatic ductal dilatation. MRCP can be obtained for further evaluation. Electronically Signed   By: Maisie Fushomas  Register   On: 02/10/2016 15:55    Anti-infectives: Anti-infectives    Start     Dose/Rate Route Frequency Ordered Stop   02/10/16 2230  cefTRIAXone (ROCEPHIN) 2 g in dextrose 5 % 50 mL IVPB     2 g 100 mL/hr over 30 Minutes Intravenous Every 24 hours 02/10/16 2033         Assessment/Plan Ruptured gallbladder S/p LAPAROSCOPIC CHOLECYSTECTOMY 1/4 Dr. Maisie Fushomas - POD 1 - drain 640cc/24hr serosanguinous fluid - bilirubin 0.4 today - WBC up to 19.7, afebrile -  Tolerating diet, pain well controlled, passing flatus  Tobacco abuse - patient planning to quit from now on  ID - rocephin 1/3>> VTE - lovenox FEN - full liquids  Plan - Bilirubin WNL. Advance to full liquids. Continue drain and antibiotics. Continue to mobilize. CBC in AM   LOS: 2 days    Edson Snowball , [facility] Surgery 02/12/2016, 7:52 AM Pager: [phone] Consults: [phone] Mon-Fri 7:00 am-4:30 pm Sat-Sun 7:00 am-11:30 am

## 2016-02-13 LAB — CBC
HEMATOCRIT: 34.9 % — AB (ref 39.0–52.0)
HEMOGLOBIN: 11.8 g/dL — AB (ref 13.0–17.0)
MCH: 30.3 pg (ref 26.0–34.0)
MCHC: 33.8 g/dL (ref 30.0–36.0)
MCV: 89.5 fL (ref 78.0–100.0)
Platelets: 240 10*3/uL (ref 150–400)
RBC: 3.9 MIL/uL — ABNORMAL LOW (ref 4.22–5.81)
RDW: 13.2 % (ref 11.5–15.5)
WBC: 11 10*3/uL — ABNORMAL HIGH (ref 4.0–10.5)

## 2016-02-13 NOTE — Consult Note (Signed)
Referring Provider:   Dr. Darnell Level Primary Care Physician:  Kristian Covey, MD Primary Gastroenterologist:  None (unassigned)  Reason for Consultation:  Bile leak  HPI: [name] is a 46 y.o. male 2 days status post laparoscopic cholecystectomy, at which time the gallbladder was perforated, anatomy was distorted, and no intraoperative cholangiogram could be performed. There did appear to be a small bile leak. A drain was placed.   Since his surgery, the patient has done well, but his drain is putting out large amounts of clear, dark amber bile.   He is comfortable and hungry.  The surgeons have asked [place] to see the patient for consideration of biliary stent placement to help remedy the bile leak. This is not felt to be urgent, however.   Past Medical History:  Diagnosis Date  . Medical history non-contributory     Past Surgical History:  Procedure Laterality Date  . CHOLECYSTECTOMY N/A 02/11/2016   Procedure: LAPAROSCOPIC CHOLECYSTECTOMY;  Surgeon: Romie Levee, MD;  Location: [facility]S;  Service: General;  Laterality: N/A;  . NO PAST SURGERIES      Prior to Admission medications   Medication Sig Start Date End Date Taking? Authorizing Provider  acetaminophen (TYLENOL) 500 MG tablet Take 1,000 mg by mouth every 6 (six) hours as needed for headache.   Yes Historical Provider, MD  dicyclomine (BENTYL) 20 MG tablet Take 1 tablet (20 mg total) by mouth 2 (two) times daily. Patient not taking: Reported on 02/03/2016 12/03/14   Emilia Beck, PA-C  famotidine-calcium carbonate-magnesium hydroxide (PEPCID COMPLETE) 10-800-165 MG chewable tablet Chew 1 tablet by mouth 3 (three) times daily before meals. 02/03/16 02/06/16  Gerhard Munch, MD  meloxicam (MOBIC) 15 MG tablet Take 1 tablet (15 mg total) by mouth daily. Patient not taking: Reported on 02/03/2016 12/03/14   Emilia Beck, PA-C  omeprazole (PRILOSEC) 20 MG capsule Take 1 capsule (20 mg total) by mouth daily.  Take one tablet daily Patient not taking: Reported on 02/10/2016 02/03/16   Gerhard Munch, MD  oxyCODONE-acetaminophen (PERCOCET/ROXICET) 5-325 MG tablet Take 1 tablet by mouth every 4 (four) hours as needed for severe pain. Patient not taking: Reported on 02/03/2016 12/01/14   Raeford Razor, MD    Current Facility-Administered Medications  Medication Dose Route Frequency Provider Last Rate Last Dose  . acetaminophen (TYLENOL) tablet 1,000 mg  1,000 mg Oral Q8H Karie Soda, MD   1,000 mg at 02/13/16 0518  . alum & mag hydroxide-simeth (MAALOX/MYLANTA) 200-200-20 MG/5ML suspension 30 mL  30 mL Oral Q6H PRN Karie Soda, MD      . cefTRIAXone (ROCEPHIN) 2 g in dextrose 5 % 50 mL IVPB  2 g Intravenous Q24H Karie Soda, MD   2 g at 02/12/16 2144  . Chlorhexidine Gluconate Cloth 2 % PADS 6 each  6 each Topical Q0600 Edson Snowball, PA-C   6 each at 02/13/16 0518  . diphenhydrAMINE (BENADRYL) 12.5 MG/5ML elixir 12.5 mg  12.5 mg Oral Q6H PRN Karie Soda, MD       Or  . diphenhydrAMINE (BENADRYL) injection 12.5 mg  12.5 mg Intravenous Q6H PRN Karie Soda, MD      . enoxaparin (LOVENOX) injection 40 mg  40 mg Subcutaneous Q24H Karie Soda, MD   40 mg at 02/13/16 0919  . guaiFENesin-dextromethorphan (ROBITUSSIN DM) 100-10 MG/5ML syrup 10 mL  10 mL Oral Q4H PRN Karie Soda, MD      . hydrALAZINE (APRESOLINE) injection 10 mg  10 mg Intravenous Q2H PRN Viviann Spare  Gross, MD      . HYDROmorphone (DILAUDID) injection 0.5-2 mg  0.5-2 mg Intravenous Q1H PRN Karie SodaSteven Gross, MD   1 mg at 02/13/16 0037  . ibuprofen (ADVIL,MOTRIN) tablet 400-800 mg  400-800 mg Oral Q6H PRN Karie SodaSteven Gross, MD      . lactated ringers infusion   Intravenous Continuous Romie LeveeAlicia Thomas, MD 50 mL/hr at 02/13/16 [phone]    . lip balm (CARMEX) ointment 1 application  1 application Topical BID Karie SodaSteven Gross, MD   1 application at 02/13/16 0920  . magic mouthwash  15 mL Oral QID PRN Karie SodaSteven Gross, MD      . menthol-cetylpyridinium (CEPACOL) lozenge  3 mg  1 lozenge Oral PRN Karie SodaSteven Gross, MD      . methocarbamol (ROBAXIN) 1,000 mg in dextrose 5 % 50 mL IVPB  1,000 mg Intravenous Q6H PRN Karie SodaSteven Gross, MD       Or  . methocarbamol (ROBAXIN) tablet 1,000 mg  1,000 mg Oral Q6H PRN Karie SodaSteven Gross, MD   1,000 mg at 02/11/16 1617  . metoCLOPramide (REGLAN) injection 5-10 mg  5-10 mg Intravenous Q6H PRN Karie SodaSteven Gross, MD      . Morphine Sulfate (PF) SOLN 2-4 mg  2-4 mg Intravenous Q3H PRN Romie LeveeAlicia Thomas, MD      . mupirocin ointment (BACTROBAN) 2 % 1 application  1 application Nasal BID Edson SnowballBrooke A Miller, PA-C   1 application at 02/13/16 0919  . nicotine (NICODERM CQ - dosed in mg/24 hours) patch 14 mg  14 mg Transdermal Daily Karie SodaSteven Gross, MD   14 mg at 02/13/16 [id number]  . ondansetron (ZOFRAN-ODT) disintegrating tablet 4 mg  4 mg Oral Q6H PRN Karie SodaSteven Gross, MD       Or  . ondansetron [facility](ZOFRAN) injection 4 mg  4 mg Intravenous Q6H PRN Karie SodaSteven Gross, MD      . oxyCODONE (Oxy IR/ROXICODONE) immediate release tablet 5-10 mg  5-10 mg Oral Q4H PRN Karie SodaSteven Gross, MD   10 mg at 02/13/16 0920  . phenol (CHLORASEPTIC) mouth spray 2 spray  2 spray Mouth/Throat PRN Karie SodaSteven Gross, MD      . pneumococcal 23 valent vaccine (PNU-IMMUNE) injection 0.5 mL  0.5 mL Intramuscular Tomorrow-1000 Lyndal Pulleyaniel Knott, MD      . prochlorperazine (COMPAZINE) injection 5-10 mg  5-10 mg Intravenous Q4H PRN Karie SodaSteven Gross, MD      . simethicone [facility](MYLICON) chewable tablet 40 mg  40 mg Oral Q6H PRN Karie SodaSteven Gross, MD      . vitamin C (ASCORBIC ACID) tablet 500 mg  500 mg Oral BID Karie SodaSteven Gross, MD   500 mg at 02/13/16 0919    Allergies as of 02/10/2016  . (No Known Allergies)    Family History  Problem Relation Age of Onset  . Hypertension Mother   . Hypertension Maternal Grandmother   . Diabetes Maternal Grandmother   . Heart disease Paternal Grandfather   . Diabetes Paternal Grandfather     Social History   Social History  . Marital status: Married    Spouse name: N/A  . Number of  children: N/A  . Years of education: N/A   Occupational History  . Not on file.   Social History Main Topics  . Smoking status: Current Every Day Smoker    Packs/day: 1.50    Years: 20.00    Types: Cigarettes  . Smokeless tobacco: Never Used  . Alcohol use No  . Drug use: No  . Sexual activity: Not on file  Other Topics Concern  . Not on file   Social History Narrative  . No narrative on file    Review of Systems: Not obtained  Physical Exam: Vital signs in last 24 hours: Temp:  [98.1 F (36.7 C)-98.2 F (36.8 C)] 98.1 F (36.7 C) (01/06 0516) Pulse Rate:  [69-84] 69 (01/06 0516) Resp:  [16-18] 16 (01/06 0516) BP: (114-144)/(71-83) 114/82 (01/06 0516) SpO2:  [98 %-100 %] 98 % (01/06 0516) Last BM Date: 02/13/16 General:   Alert,  Well-developed, well-nourished, pleasant and cooperative in NAD Head:  Normocephalic and atraumatic. Eyes:  Sclera clear, no icterus.   Conjunctiva pink. Mouth:   No ulcerations or lesions.  Oropharynx pink & moist. Neck:   No masses or thyromegaly. Lungs:  Clear throughout to auscultation.   No wheezes, crackles, or rhonchi. No evident respiratory distress. Heart:   Regular rate and rhythm; no murmurs, clicks, rubs,  or gallops. Abdomen:   Laparoscopic incisions appear intact. There is a drain in the right upper quadrant draining to a bulb with approximately 30 ML's of clear, dark amber bile. No tenderness. Msk:   Symmetrical without gross deformities. Neurologic:  Alert and coherent;  grossly normal neurologically. Skin:  Intact without significant lesions or rashes. Multiple tattoos present. Psych:   Alert and cooperative. Normal mood and affect.  Intake/Output from previous day: 01/05 0701 - 01/06 0700 In: 2210 [P.O.:960; I.V.:1200; IV Piggyback:50] Out: 415 [Drains:415] Intake/Output this shift: Total I/O In: 360 [P.O.:360] Out: 60 [Drains:60]  Lab Results:  Recent Labs  02/10/16 1525 02/12/16 0456 02/13/16 0536  WBC  10.7* 19.7* 11.0*  HGB 14.5 13.0 11.8*  HCT 43.5 38.3* 34.9*  PLT 225 226 240   BMET  Recent Labs  02/10/16 1525  NA 136  K 4.3  CL 101  CO2 28  GLUCOSE 93  BUN 19  CREATININE 0.94  CALCIUM 9.2   LFT  Recent Labs  02/10/16 1525 02/12/16 0456  PROT 7.4  --   ALBUMIN 3.9  --   AST 28  --   ALT 50  --   ALKPHOS 111  --   BILITOT 0.6 0.4   PT/INR No results for input(s): LABPROT, INR in the last 72 hours.  Studies/Results: No results found.  Impression: Uncomplicated bile leak following recent, technically difficult laparoscopic cholecystectomy. The situation is controlled, by virtue of the fact that the patient has a functioning right upper quadrant drain. Accordingly, the patient is asymptomatic with this leak.  Plan: I have explained the nature, purpose, and risks of ERCP with biliary stent placement, as well as possible sphincterotomy and perhaps even stone extraction. Risks include pancreatitis (under 5% risk) which can rarely be fatal, anesthesia problems, bleeding, infection, and perforation. I discussed this with both the patient and his wife, with the aid of a diagram.  They seem to have a good understanding, and are very agreeable to proceed. If logistically possible, the patient would prefer to have this prior to being discharged from the hospital. I will look into availability of schedule time on this weekend, keeping in mind that it requires general anesthesia, coordination of staff, biliary endoscopists, etc.   LOS: 3 days   Tynleigh Birt V  02/13/2016, 11:00 AM   Pager [phone] If no answer or after 5 PM call [phone]

## 2016-02-13 NOTE — Anesthesia Postprocedure Evaluation (Signed)
Anesthesia Post Note  Patient: [name]  Procedure(s) Performed: Procedure(s) (LRB): LAPAROSCOPIC CHOLECYSTECTOMY (N/A)  Anesthesia Type: General        Last Vitals:  Vitals:   02/12/16 2121 02/13/16 0516  BP: (!) 144/83 114/82  Pulse: 84 69  Resp: 18 16  Temp: 36.8 C 36.7 C    Last Pain:  Vitals:   02/13/16 0612  TempSrc:   PainSc: 6    Pain Goal: Patients Stated Pain Goal: 3 (02/13/16 0524)               Jiles GarterJACKSON,Galdino Hinchman EDWARD

## 2016-02-13 NOTE — Progress Notes (Signed)
  General Surgery [facility]  Assessment & Plan:  Status post lap cholecystectomy, drain placement - POD#2  Drain with obvious bile in output  Tolerating clear liquid diet  Will request GI consultation for possible stent placement        Velora Hecklerodd M. Jaxtyn Linville, MD, [facility]       [facility]       Office: [phone]    Subjective: Patient up at bedside, comfortable.  Wants to go home.  No nausea or emesis.  Pain controlled.  Objective: Vital signs in last 24 hours: Temp:  [98.1 F (36.7 C)-98.2 F (36.8 C)] 98.1 F (36.7 C) (01/06 0516) Pulse Rate:  [69-84] 69 (01/06 0516) Resp:  [16-18] 16 (01/06 0516) BP: (114-144)/(71-83) 114/82 (01/06 0516) SpO2:  [98 %-100 %] 98 % (01/06 0516) Last BM Date: 02/13/16  Intake/Output from previous day: 01/05 0701 - 01/06 0700 In: 2210 [P.O.:960; I.V.:1200; IV Piggyback:50] Out: 415 [Drains:415] Intake/Output this shift: Total I/O In: 360 [P.O.:360] Out: 60 [Drains:60]  Physical Exam: HEENT - sclerae clear, mucous membranes moist Abdomen - JP drain right lateral port site with bilious output; incisions dry and intact; soft; mild tenderness Ext - no edema, non-tender Neuro - alert & oriented, no focal deficits  Lab Results:   Recent Labs  02/12/16 0456 02/13/16 0536  WBC 19.7* 11.0*  HGB 13.0 11.8*  HCT 38.3* 34.9*  PLT 226 240   BMET  Recent Labs  02/10/16 1525  NA 136  K 4.3  CL 101  CO2 28  GLUCOSE 93  BUN 19  CREATININE 0.94  CALCIUM 9.2   PT/INR No results for input(s): LABPROT, INR in the last 72 hours. Comprehensive Metabolic Panel:    Component Value Date/Time   NA 136 02/10/2016 1525   NA 138 02/03/2016 2237   K 4.3 02/10/2016 1525   K 4.9 02/03/2016 2237   CL 101 02/10/2016 1525   CL 104 02/03/2016 2237   CO2 28 02/10/2016 1525   CO2 25 02/03/2016 2237   BUN 19 02/10/2016 1525   BUN 24 (H) 02/03/2016 2237   CREATININE 0.94 02/10/2016 1525   CREATININE 1.01  02/03/2016 2237   GLUCOSE 93 02/10/2016 1525   GLUCOSE 117 (H) 02/03/2016 2237   CALCIUM 9.2 02/10/2016 1525   CALCIUM 9.3 02/03/2016 2237   AST 28 02/10/2016 1525   AST 302 (H) 02/03/2016 2237   ALT 50 02/10/2016 1525   ALT 213 (H) 02/03/2016 2237   ALKPHOS 111 02/10/2016 1525   ALKPHOS 100 02/03/2016 2237   BILITOT 0.4 02/12/2016 0456   BILITOT 0.6 02/10/2016 1525   PROT 7.4 02/10/2016 1525   PROT 8.1 02/03/2016 2237   ALBUMIN 3.9 02/10/2016 1525   ALBUMIN 4.7 02/03/2016 2237    Studies/Results: No results found.    Placido Hangartner M 02/13/2016  Patient ID: [name], male   DOB: 08/31/1970, 46 y.o.   MRN: [id number]

## 2016-02-14 ENCOUNTER — Inpatient Hospital Stay (HOSPITAL_COMMUNITY): Payer: BLUE CROSS/BLUE SHIELD | Admitting: Certified Registered Nurse Anesthetist

## 2016-02-14 ENCOUNTER — Encounter (HOSPITAL_COMMUNITY): Admission: EM | Disposition: A | Discharge status: Home Health | Payer: Self-pay | Source: Home / Self Care

## 2016-02-14 ENCOUNTER — Inpatient Hospital Stay (HOSPITAL_COMMUNITY): Payer: BLUE CROSS/BLUE SHIELD

## 2016-02-14 ENCOUNTER — Encounter (HOSPITAL_COMMUNITY): Payer: Self-pay

## 2016-02-14 HISTORY — PX: ENDOSCOPIC RETROGRADE CHOLANGIOPANCREATOGRAPHY (ERCP) WITH PROPOFOL: SHX5810

## 2016-02-14 LAB — CBC WITH DIFFERENTIAL/PLATELET
BASOS ABS: 0 10*3/uL (ref 0.0–0.1)
BASOS PCT: 0 %
EOS ABS: 0.1 10*3/uL (ref 0.0–0.7)
EOS PCT: 1 %
HCT: 33.2 % — ABNORMAL LOW (ref 39.0–52.0)
Hemoglobin: 11.2 g/dL — ABNORMAL LOW (ref 13.0–17.0)
Lymphocytes Relative: 32 %
Lymphs Abs: 2 10*3/uL (ref 0.7–4.0)
MCH: 30.3 pg (ref 26.0–34.0)
MCHC: 33.7 g/dL (ref 30.0–36.0)
MCV: 89.7 fL (ref 78.0–100.0)
Monocytes Absolute: 0.6 10*3/uL (ref 0.1–1.0)
Monocytes Relative: 9 %
Neutro Abs: 3.7 10*3/uL (ref 1.7–7.7)
Neutrophils Relative %: 58 %
PLATELETS: 251 10*3/uL (ref 150–400)
RBC: 3.7 MIL/uL — AB (ref 4.22–5.81)
RDW: 13.3 % (ref 11.5–15.5)
WBC: 6.3 10*3/uL (ref 4.0–10.5)

## 2016-02-14 LAB — COMPREHENSIVE METABOLIC PANEL
ALT: 26 U/L (ref 17–63)
ANION GAP: 8 (ref 5–15)
AST: 16 U/L (ref 15–41)
Albumin: 2.9 g/dL — ABNORMAL LOW (ref 3.5–5.0)
Alkaline Phosphatase: 68 U/L (ref 38–126)
BILIRUBIN TOTAL: 0.3 mg/dL (ref 0.3–1.2)
BUN: 24 mg/dL — ABNORMAL HIGH (ref 6–20)
CHLORIDE: 103 mmol/L (ref 101–111)
CO2: 27 mmol/L (ref 22–32)
Calcium: 8.8 mg/dL — ABNORMAL LOW (ref 8.9–10.3)
Creatinine, Ser: 0.98 mg/dL (ref 0.61–1.24)
GFR calc Af Amer: >60 mL/min (ref >60)
Glucose, Bld: 91 mg/dL (ref 65–99)
Potassium: 3.9 mmol/L (ref 3.5–5.1)
Sodium: 138 mmol/L (ref 135–145)
TOTAL PROTEIN: 6 g/dL — AB (ref 6.5–8.1)

## 2016-02-14 LAB — TYPE AND SCREEN
ABO/RH(D): A POS
ANTIBODY SCREEN: NEGATIVE

## 2016-02-14 LAB — ABO/RH: ABO/RH(D): A POS

## 2016-02-14 SURGERY — ERCP, WITH INTERVENTION IF INDICATED
Anesthesia: General

## 2016-02-14 SURGERY — ENDOSCOPIC RETROGRADE CHOLANGIOPANCREATOGRAPHY (ERCP) WITH PROPOFOL
Anesthesia: General

## 2016-02-14 MED ORDER — SODIUM CHLORIDE 0.9 % IV SOLN: INTRAVENOUS | Status: DC

## 2016-02-14 MED ORDER — SUCCINYLCHOLINE CHLORIDE 20 MG/ML IJ SOLN
INTRAMUSCULAR | Status: DC | PRN
  Administered 2016-02-14: 120 mg via INTRAVENOUS

## 2016-02-14 MED ORDER — FENTANYL CITRATE (PF) 100 MCG/2ML IJ SOLN
INTRAMUSCULAR | Status: AC
  Filled 2016-02-14: qty 2

## 2016-02-14 MED ORDER — LIDOCAINE 2% (20 MG/ML) 5 ML SYRINGE
INTRAMUSCULAR | Status: DC | PRN
  Administered 2016-02-14: 60 mg via INTRAVENOUS

## 2016-02-14 MED ORDER — MIDAZOLAM HCL 2 MG/2ML IJ SOLN
INTRAMUSCULAR | Status: AC
  Filled 2016-02-14: qty 2

## 2016-02-14 MED ORDER — PROPOFOL 10 MG/ML IV BOLUS
INTRAVENOUS | Status: AC
  Filled 2016-02-14: qty 20

## 2016-02-14 MED ORDER — ONDANSETRON HCL 4 MG/2ML IJ SOLN
INTRAMUSCULAR | Status: AC
  Filled 2016-02-14: qty 2

## 2016-02-14 MED ORDER — LACTATED RINGERS IV SOLN
INTRAVENOUS | Status: DC
  Administered 2016-02-14: 11:00:00 via INTRAVENOUS

## 2016-02-14 MED ORDER — MIDAZOLAM HCL 5 MG/5ML IJ SOLN
INTRAMUSCULAR | Status: DC | PRN
  Administered 2016-02-14: 2 mg via INTRAVENOUS

## 2016-02-14 MED ORDER — PROPOFOL 10 MG/ML IV BOLUS
INTRAVENOUS | Status: DC | PRN
  Administered 2016-02-14: 200 mg via INTRAVENOUS

## 2016-02-14 MED ORDER — FENTANYL CITRATE (PF) 100 MCG/2ML IJ SOLN
INTRAMUSCULAR | Status: DC | PRN
  Administered 2016-02-14: 100 ug via INTRAVENOUS

## 2016-02-14 MED ORDER — SODIUM CHLORIDE 0.9 % IV SOLN
INTRAVENOUS | Status: DC | PRN
  Administered 2016-02-14: 50 mL

## 2016-02-14 NOTE — Anesthesia Preprocedure Evaluation (Signed)
Anesthesia Evaluation  Patient identified by MRN, date of birth, ID band Patient awake    Reviewed: Allergy & Precautions, H&P , Patient's Chart, lab work & pertinent test results, reviewed documented beta blocker date and time   Airway Mallampati: II  TM Distance: >3 FB Neck ROM: full    Dental no notable dental hx.    Pulmonary Current Smoker,    Pulmonary exam normal breath sounds clear to auscultation       Cardiovascular  Rhythm:regular Rate:Normal     Neuro/Psych    GI/Hepatic   Endo/Other    Renal/GU      Musculoskeletal   Abdominal   Peds  Hematology   Anesthesia Other Findings   Reproductive/Obstetrics                             Anesthesia Physical  Anesthesia Plan  ASA: II  Anesthesia Plan: General   Post-op Pain Management:    Induction: Intravenous  Airway Management Planned: Oral ETT  Additional Equipment:   Intra-op Plan:   Post-operative Plan: Extubation in OR  Informed Consent: I have reviewed the patients History and Physical, chart, labs and discussed the procedure including the risks, benefits and alternatives for the proposed anesthesia with the patient or authorized representative who has indicated his/her understanding and acceptance.   Dental Advisory Given and Dental advisory given  Plan Discussed with: CRNA and Surgeon  Anesthesia Plan Comments: ( )        Anesthesia Quick Evaluation

## 2016-02-14 NOTE — Op Note (Signed)
[facility] Patient Name: [name] Procedure Date: 02/14/2016 MRN: [id number] Attending MD: Vida Rigger , MD Date of Birth: 03-03-1970 CSN: [id number] Age: 46 Admit Type: Inpatient Procedure:                ERCP Indications:              Suspected bile leak Providers:                Vida Rigger, MD, Will Bonnet RN, RN, Harrington Challenger, Technician Referring MD:              Medicines:                General Anesthesia Complications:            No immediate complications. Estimated Blood Loss:     Estimated blood loss: none. Procedure:                Pre-Anesthesia Assessment:                           - Prior to the procedure, a History and Physical                            was performed, and patient medications and                            allergies were reviewed. The patient's tolerance of                            previous anesthesia was also reviewed. The risks                            and benefits of the procedure and the sedation                            options and risks were discussed with the patient.                            All questions were answered, and informed consent                            was obtained. Prior Anticoagulants: The patient has                            taken Lovenox (enoxaparin), last dose was 1 day                            prior to procedure. ASA Grade Assessment: I - A                            normal, healthy patient. After reviewing the risks  and benefits, the patient was deemed in                            satisfactory condition to undergo the procedure.                           After obtaining informed consent, the scope was                            passed under direct vision. Throughout the                            procedure, the patient's blood pressure, pulse, and                            oxygen saturations were monitored continuously. The                     [id number] ([id number]) scope was introduced through                            the mouth, and used to inject contrast into and                            used to locate the major papilla. The ERCP was                            accomplished without difficulty. The patient                            tolerated the procedure well. Scope In: Scope Out: Findings:      The major papilla was normal. there was no pancreatic duct injection or       wire advancement throughout the procedure and deep selective cannulation       was obtained on the first attempt and we thought we saw a stone on       initial cholangiogram but throughout the procedure did not see an       obvious biliary leak and we proceeded with the Biliary sphincterotomy       was made with a Hydratome sphincterotome using ERBE electrocauteryin the       customary fashion until we had some drainage and could easily get the       fully bowed sphincterotome easily in and out of the duct. There was no       post-sphincterotomy bleeding. The biliary tree was swept with a 12 mm       balloon starting at the upper third of the main bile duct. One stone was       removed. No stones remained. multiple other balloon pull through's were       done without residual stones and we did do multiple prolonged occlusion       cholangiograms throughout the length of the CBD even using the 15 mm       balloon but again could not definitively say where the leak was and we       elected to place One 10 Fr by 5 cm temporary stent  with a single       external flap and a single internal flap was placed 4 cm into the common       bile duct. Fluid flowed through the stent. The stent was in good       position. the wire and introducer were removed and the scope was removed       and the patient tolerated the procedure well Impression:               - The major papilla appeared normal. no pancreatic                            duct injection or  wire advancement throughout the                            procedure                           - Choledocholithiasis was found. Complete removal                            was accomplished by biliary sphincterotomy and                            balloon extraction.                           - A biliary sphincterotomy was performed.                           - The biliary tree was swept.                           - One temporary stent was placed into the common                            bile duct. no obvious significant leak was seen                            however possibly the drain abuts the cystic duct so                            there is nothing more than a little blush only Moderate Sedation:      N/A- Per Anesthesia Care Recommendation:           - Clear liquid diet for 6 hours. tonight may slowly                            advance diet as tolerated                           - Continue present medications.                           - Check liver enzymes (AST, ALT, alkaline  phosphatase, bilirubin) electrolytes and CBC in the                            morning.                           - Return to GI clinic PRN.                           - Telephone GI clinic if symptomatic PRN.                           - Repeat ERCP in 6 weeks to remove stent as long as                            patient does well postop and they are able to get                            to drain out in a reasonable amount of time and                            will be available as an outpatient to help as needed Procedure Code(s):        --- Professional ---                           [phone], Esophagogastroduodenoscopy, flexible,                            transoral; diagnostic, including collection of                            specimen(s) by brushing or washing, when performed                            (separate procedure) Diagnosis Code(s):        --- Professional ---                            K80.50, Calculus of bile duct without cholangitis                            or cholecystitis without obstruction CPT copyright 2016 [employer] Association. All rights reserved. The codes documented in this report are preliminary and upon coder review may  be revised to meet current compliance requirements. Vida Rigger, MD 02/14/2016 10:30:02 AM This report has been signed electronically. Number of Addenda: 0

## 2016-02-14 NOTE — Anesthesia Postprocedure Evaluation (Signed)
Anesthesia Post Note  Patient: Ardith DarkJulian Scott Bogucki  Procedure(s) Performed: Procedure(s) (LRB): ENDOSCOPIC RETROGRADE CHOLANGIOPANCREATOGRAPHY (ERCP) WITH PROPOFOL (N/A)  Patient location during evaluation: PACU Anesthesia Type: General Level of consciousness: awake and alert Pain management: pain level controlled Vital Signs Assessment: post-procedure vital signs reviewed and stable Respiratory status: spontaneous breathing, nonlabored ventilation, respiratory function stable and patient connected to nasal cannula oxygen Cardiovascular status: blood pressure returned to baseline and stable Postop Assessment: no signs of nausea or vomiting Anesthetic complications: no       Last Vitals:  Vitals:   02/14/16 0904 02/14/16 1031  BP: (!) 130/56 (!) 141/96  Pulse: 69 86  Resp: 15 14  Temp: 36.7 C 36.6 C    Last Pain:  Vitals:   02/14/16 0904  TempSrc: Oral  PainSc:                  Phillips Groutarignan, Joevon Holliman

## 2016-02-14 NOTE — Progress Notes (Signed)
Carlos HuskyJulian Scott Morgan 9:28 AM  Subjective: Patient seen and examined and his hospital computer chart reviewed and his case discussed with my partner Dr. B and with the patient's wife as well he is currently feeling well without pain but has gallbladder symptoms for over a year but no swallowing problems and no other GI issues or medical complaints  Objective: Vital signs stable afebrile no acute distress exam please see preassessment evaluation drain with increased output labs okay ultrasound reports reviewed  Assessment: Bile leak  Plan: The risks benefits methods and success rate of ERCP was discussed with the patient and his wife and will proceed this morning with anesthesia assistance with further workup plans pending those findings  Ccal[employer] CorpMAGOD,Tomara Youngberg E  Pager [phone] After 5PM or if no answer call [phone]

## 2016-02-14 NOTE — Progress Notes (Signed)
  General Surgery [facility]  Assessment & Plan:  Status post lap cholecystectomy, drain placement - POD#3             Drain with obvious bile in output             Tolerating clear liquid diet             Appreciate prompt GI consultation and care - ERCP this AM with Dr. Ewing SchleinMagod for stent placement        Velora Hecklerodd M. Jennalee Greaves, MD, [facility]       [facility]       Office: [phone]    Subjective: Patient up in chair, going to ERCP soon.  Mild RUQ pain controlled with Rx.  Wife at bedside.  Objective: Vital signs in last 24 hours: Temp:  [98.1 F (36.7 C)-98.2 F (36.8 C)] 98.2 F (36.8 C) (01/07 0524) Pulse Rate:  [63-79] 63 (01/07 0524) Resp:  [16-18] 18 (01/07 0524) BP: (99-111)/(60-67) 104/61 (01/07 0524) SpO2:  [97 %-98 %] 97 % (01/07 0524) Last BM Date: 02/13/16  Intake/Output from previous day: 01/06 0701 - 01/07 0700 In: 2080 [P.O.:840; I.V.:1200] Out: 220 [Drains:220] Intake/Output this shift: Total I/O In: -  Out: 60 [Drains:60]  Physical Exam: HEENT - sclerae clear, mucous membranes moist Abdomen - soft, non-distended; wounds dry and intact; JP with moderate bilious output Ext - no edema, non-tender Neuro - alert & oriented, no focal deficits  Lab Results:   Recent Labs  02/13/16 0536 02/14/16 0515  WBC 11.0* 6.3  HGB 11.8* 11.2*  HCT 34.9* 33.2*  PLT 240 251   BMET  Recent Labs  02/14/16 0515  NA 138  K 3.9  CL 103  CO2 27  GLUCOSE 91  BUN 24*  CREATININE 0.98  CALCIUM 8.8*   PT/INR No results for input(s): LABPROT, INR in the last 72 hours. Comprehensive Metabolic Panel:    Component Value Date/Time   NA 138 02/14/2016 0515   NA 136 02/10/2016 1525   K 3.9 02/14/2016 0515   K 4.3 02/10/2016 1525   CL 103 02/14/2016 0515   CL 101 02/10/2016 1525   CO2 27 02/14/2016 0515   CO2 28 02/10/2016 1525   BUN 24 (H) 02/14/2016 0515   BUN 19 02/10/2016 1525   CREATININE 0.98 02/14/2016 0515   CREATININE 0.94 02/10/2016 1525   GLUCOSE 91 02/14/2016 0515   GLUCOSE 93 02/10/2016 1525   CALCIUM 8.8 (L) 02/14/2016 0515   CALCIUM 9.2 02/10/2016 1525   AST 16 02/14/2016 0515   AST 28 02/10/2016 1525   ALT 26 02/14/2016 0515   ALT 50 02/10/2016 1525   ALKPHOS 68 02/14/2016 0515   ALKPHOS 111 02/10/2016 1525   BILITOT 0.3 02/14/2016 0515   BILITOT 0.4 02/12/2016 0456   PROT 6.0 (L) 02/14/2016 0515   PROT 7.4 02/10/2016 1525   ALBUMIN 2.9 (L) 02/14/2016 0515   ALBUMIN 3.9 02/10/2016 1525    Studies/Results: No results found.    Raeann Offner M 02/14/2016  Patient ID: [name], male   DOB: 11/24/1970, 46 y.o.   MRN: [id number]

## 2016-02-14 NOTE — Transfer of Care (Signed)
Immediate Anesthesia Transfer of Care Note  Patient: Carlos DarkJulian Scott Carbine  Procedure(s) Performed: Procedure(s): ENDOSCOPIC RETROGRADE CHOLANGIOPANCREATOGRAPHY (ERCP) WITH PROPOFOL (N/A)  Patient Location: PACU  Anesthesia Type:General  Level of Consciousness: awake, alert  and oriented  Airway & Oxygen Therapy: Patient Spontanous Breathing and Patient connected to face mask oxygen  Post-op Assessment: Report given to RN and Post -op Vital signs reviewed and stable  Post vital signs: Reviewed and stable  Last Vitals:  Vitals:   02/14/16 0524 02/14/16 0904  BP: 104/61 (!) 130/56  Pulse: 63 69  Resp: 18 15  Temp: 36.8 C 36.7 C    Last Pain:  Vitals:   02/14/16 0904  TempSrc: Oral  PainSc:       Patients Stated Pain Goal: 2 (02/14/16 0718)  Complications: No apparent anesthesia complications

## 2016-02-14 NOTE — Anesthesia Procedure Notes (Signed)
Procedure Name: Intubation Performed by: Tamas Suen J Pre-anesthesia Checklist: Patient identified, Emergency Drugs available, Suction available, Patient being monitored and Timeout performed Patient Re-evaluated:Patient Re-evaluated prior to inductionOxygen Delivery Method: Circle system utilized Preoxygenation: Pre-oxygenation with 100% oxygen Intubation Type: IV induction Ventilation: Mask ventilation without difficulty Laryngoscope Size: Mac and 4 Grade View: Grade I Tube type: Oral Tube size: 7.5 mm Number of attempts: 1 Airway Equipment and Method: Stylet Placement Confirmation: ETT inserted through vocal cords under direct vision,  positive ETCO2,  CO2 detector and breath sounds checked- equal and bilateral Secured at: 23 cm Tube secured with: Tape Dental Injury: Teeth and Oropharynx as per pre-operative assessment        

## 2016-02-15 LAB — COMPREHENSIVE METABOLIC PANEL
ALBUMIN: 3 g/dL — AB (ref 3.5–5.0)
ALK PHOS: 97 U/L (ref 38–126)
ALT: 42 U/L (ref 17–63)
AST: 32 U/L (ref 15–41)
Anion gap: 7 (ref 5–15)
BUN: 16 mg/dL (ref 6–20)
CHLORIDE: 102 mmol/L (ref 101–111)
CO2: 28 mmol/L (ref 22–32)
CREATININE: 0.93 mg/dL (ref 0.61–1.24)
Calcium: 8.8 mg/dL — ABNORMAL LOW (ref 8.9–10.3)
GFR calc non Af Amer: >60 mL/min (ref >60)
GLUCOSE: 92 mg/dL (ref 65–99)
Potassium: 4 mmol/L (ref 3.5–5.1)
SODIUM: 137 mmol/L (ref 135–145)
Total Bilirubin: 0.5 mg/dL (ref 0.3–1.2)
Total Protein: 5.6 g/dL — ABNORMAL LOW (ref 6.5–8.1)

## 2016-02-15 LAB — CBC WITH DIFFERENTIAL/PLATELET
BASOS ABS: 0 10*3/uL (ref 0.0–0.1)
BASOS PCT: 0 %
EOS ABS: 0.1 10*3/uL (ref 0.0–0.7)
EOS PCT: 2 %
HCT: 34.4 % — ABNORMAL LOW (ref 39.0–52.0)
HEMOGLOBIN: 11.7 g/dL — AB (ref 13.0–17.0)
Lymphocytes Relative: 30 %
Lymphs Abs: 1.9 10*3/uL (ref 0.7–4.0)
MCH: 30.4 pg (ref 26.0–34.0)
MCHC: 34 g/dL (ref 30.0–36.0)
MCV: 89.4 fL (ref 78.0–100.0)
Monocytes Absolute: 0.6 10*3/uL (ref 0.1–1.0)
Monocytes Relative: 10 %
NEUTROS PCT: 58 %
Neutro Abs: 3.7 10*3/uL (ref 1.7–7.7)
PLATELETS: 272 10*3/uL (ref 150–400)
RBC: 3.85 MIL/uL — AB (ref 4.22–5.81)
RDW: 13.2 % (ref 11.5–15.5)
WBC: 6.3 10*3/uL (ref 4.0–10.5)

## 2016-02-15 MED ORDER — ACETAMINOPHEN 500 MG PO TABS: 1000.0000 mg | ORAL_TABLET | Freq: Four times a day (QID) | ORAL | Status: DC | PRN

## 2016-02-15 MED ORDER — MELOXICAM 15 MG PO TABS
15.0000 mg | ORAL_TABLET | Freq: Every day | ORAL | Status: DC
  Administered 2016-02-15: 15 mg via ORAL
  Filled 2016-02-15 (×2): qty 1

## 2016-02-15 MED ORDER — OXYCODONE HCL 5 MG PO TABS: 5.0000 mg | ORAL_TABLET | ORAL | 0 refills | Status: DC | PRN

## 2016-02-15 MED ORDER — PANTOPRAZOLE SODIUM 40 MG PO TBEC
80.0000 mg | DELAYED_RELEASE_TABLET | Freq: Every day | ORAL | Status: DC
  Administered 2016-02-15 – 2016-02-16 (×2): 80 mg via ORAL
  Filled 2016-02-15 (×2): qty 2

## 2016-02-15 MED ORDER — FUROSEMIDE 20 MG PO TABS
20.0000 mg | ORAL_TABLET | Freq: Once | ORAL | Status: AC
  Administered 2016-02-15: 20 mg via ORAL
  Filled 2016-02-15: qty 1

## 2016-02-15 NOTE — Progress Notes (Signed)
[facility]  Carlos DarkJulian Scott Morgan 45 y.o. 06/04/1970  CC:  Possible bile leak.   Subjective: Patient is status post ERCP yesterday. Found to have CBD stone. No bile leak was noted. Status post stent placement. Abdominal pain is improving. Tolerating soft diet. Right upper quadrant drain now has a light pink colored output.  ROS : Negative for nausea and vomiting. Tolerating diet.   Objective: Vital signs in last 24 hours: Vitals:   02/14/16 2145 02/15/16 0541  BP: (!) 141/74 (!) 111/93  Pulse: 69 (!) 59  Resp: 16 18  Temp: 97.5 F (36.4 C) 98 F (36.7 C)    Physical Exam:  General:  Alert, cooperative, no distress, appears stated age  Head:  Normocephalic, without obvious abnormality, atraumatic  Eyes:  , EOM's intact,   Lungs:   Clear to auscultation bilaterally, respirations unlabored  Heart:  Regular rate and rhythm, S1, S2 normal  Abdomen:   Mild epigastric and right upper quadrant discomfort. Mildly distended. Bowel sounds present. No peritoneal signs.,   Extremities: Extremities normal, atraumatic, no  edema  Pulses: 2+ and symmetric    Lab Results:  Recent Labs  02/14/16 0515 02/15/16 0512  NA 138 137  K 3.9 4.0  CL 103 102  CO2 27 28  GLUCOSE 91 92  BUN 24* 16  CREATININE 0.98 0.93  CALCIUM 8.8* 8.8*    Recent Labs  02/14/16 0515 02/15/16 0512  AST 16 32  ALT 26 42  ALKPHOS 68 97  BILITOT 0.3 0.5  PROT 6.0* 5.6*  ALBUMIN 2.9* 3.0*    Recent Labs  02/14/16 0515 02/15/16 0512  WBC 6.3 6.3  NEUTROABS 3.7 3.7  HGB 11.2* 11.7*  HCT 33.2* 34.4*  MCV 89.7 89.4  PLT 251 272   No results for input(s): LABPROT, INR in the last 72 hours.    Assessment/Plan: - Increased biliary output at right upper quadrant drainage status post complicated cholecystectomy concern for bile leak.Marland Kitchen. ERCP showed no obvious bile leak. Found to have CBD stone which was removed with a sphincterotomy. Status post  Stent Placement for  drainage. - Status post laparoscopic cholecystectomy.  Recommendations -------------------------- - Patient is afebrile. No leukocytosis. Tolerating diet. LFTs remains normal. - Biliary drain now has a light pink colored output but hemoglobin remained stable. Abdominal pain is improving. - Follow-up with Dr. Ewing SchleinMagod  in 2-4 weeks after discharge. Patient will need a repeat ERCP for stent removal in 6-8 weeks.  Kathi DerParag Tarrie Mcmichen MD, FACP 02/15/2016, 10:15 AM  Pager [phone]  If no answer or after 5 PM call [phone]

## 2016-02-15 NOTE — Discharge Instructions (Signed)
LAPAROSCOPIC SURGERY: POST OP INSTRUCTIONS  1. DIET: Follow a light bland diet the first 24 hours after arrival home, such as soup, liquids, crackers, etc. Be sure to include lots of fluids daily. Avoid fast food or heavy meals as your are more likely to get nauseated. Eat a low fat the next few days after surgery.  2. Take your usually prescribed home medications unless otherwise directed. 3. PAIN CONTROL:  1. Pain is best controlled by a usual combination of three different methods TOGETHER:  1. Ice/Heat 2. Over the counter pain medication 3. Prescription pain medication 2. Most patients will experience some swelling and bruising around the incisions. Ice packs or heating pads (30-60 minutes up to 6 times a day) will help. Use ice for the first few days to help decrease swelling and bruising, then switch to heat to help relax tight/sore spots and speed recovery. Some people prefer to use ice alone, heat alone, alternating between ice & heat. Experiment to what works for you. Swelling and bruising can take several weeks to resolve.  3. It is helpful to take an over-the-counter pain medication regularly for the first few weeks. Choose one of the following that works best for you:  1. Naproxen (Aleve, etc) Two 220mg  tabs twice a day 2. Ibuprofen (Advil, etc) Three 200mg  tabs four times a day (every meal & bedtime) 3. Acetaminophen (Tylenol, etc) 500-650mg  four times a day (every meal & bedtime) 4. A prescription for pain medication (such as oxycodone, hydrocodone, etc) should be given to you upon discharge. Take your pain medication as prescribed.  1. If you are having problems/concerns with the prescription medicine (does not control pain, nausea, vomiting, rash, itching, etc), please call us [phone] to see if we need to switch you to a different pain medicine that will work better for you and/or control your side effect better. 2. If you need a refill on your pain medication, please contact  your pharmacy. They will contact our office to request authorization. Prescriptions will not be filled after 5 pm or on week-ends. 4. Avoid getting constipated. Between the surgery and the pain medications, it is common to experience some constipation. Increasing fluid intake and taking a fiber supplement (such as Metamucil, Citrucel, FiberCon, MiraLax, etc) 1-2 times a day regularly will usually help prevent this problem from occurring. A mild laxative (prune juice, Milk of Magnesia, MiraLax, etc) should be taken according to package directions if there are no bowel movements after 48 hours.  5. Watch out for diarrhea. If you have many loose bowel movements, simplify your diet to bland foods & liquids for a few days. Stop any stool softeners and decrease your fiber supplement. Switching to mild anti-diarrheal medications (Kayopectate, Pepto Bismol) can help. If this worsens or does not improve, please call us. 6. Wash / shower every day. You may shower over the dressings as they are waterproof. Continue to shower over incision(s) after the dressing is off. 7. Remove your waterproof bandages 5 days after surgery. You may leave the incision open to air. You may replace a dressing/Band-Aid to cover the incision for comfort if you wish.  8. ACTIVITIES as tolerated:  1. You may resume regular (light) daily activities beginning the next day--such as daily self-care, walking, climbing stairs--gradually increasing activities as tolerated. If you can walk 30 minutes without difficulty, it is safe to try more intense activity such as jogging, treadmill, bicycling, low-impact aerobics, swimming, etc. 2. Save the most intensive and strenuous activity for  last such as sit-ups, heavy lifting, contact sports, etc Refrain from any heavy lifting or straining until you are off narcotics for pain control.  3. DO NOT PUSH THROUGH PAIN. Let pain be your guide: If it hurts to do something, don't do it. Pain is your body warning  you to avoid that activity for another week until the pain goes down. 4. You may drive when you are no longer taking prescription pain medication, you can comfortably wear a seatbelt, and you can safely maneuver your car and apply brakes. 5. You may have sexual intercourse when it is comfortable.  9. FOLLOW UP in our office  1. Please call [facility] at [phone] to set up an appointment to see your surgeon in the office for a follow-up appointment approximately 2-3 weeks after your surgery. 2. Make sure that you call for this appointment the day you arrive home to insure a convenient appointment time.      10. IF YOU HAVE DISABILITY OR FAMILY LEAVE FORMS, BRING THEM TO THE               OFFICE FOR PROCESSING.   WHEN TO CALL us [phone]:  1. Poor pain control 2. Reactions / problems with new medications (rash/itching, nausea, etc)  3. Fever over 101.5 F (38.5 C) 4. Inability to urinate 5. Nausea and/or vomiting 6. Worsening swelling or bruising 7. Continued bleeding from incision. 8. Increased pain, redness, or drainage from the incision  The clinic staff is available to answer your questions during regular business hours (8:30am-5pm). Please dont hesitate to call and ask to speak to one of our nurses for clinical concerns.  If you have a medical emergency, go to the nearest emergency room or call 911.  A surgeon from [facility] is always on call at the [facility], [place]  [address], [place], [place] [address] ?  MAIN: [phone] ? TOLL FREE: [phone] ?  FAX [phone]  [email or web address]   [facility] Introduction Surgical drains are used to remove extra fluid that normally builds up in a surgical wound after surgery. A surgical drain helps to heal a surgical wound. Different kinds of surgical drains include:  Active drains. These drains use suction to pull drainage away from the  surgical wound. Drainage flows through a tube to a container outside of the body. It is important to keep the bulb or the drainage container flat (compressed) at all times, except while you empty it. Flattening the bulb or container creates suction. The two most common types of active drains are bulb drains and Hemovac drains.  Passive drains. These drains allow fluid to drain naturally, by gravity. Drainage flows through a tube to a bandage (dressing) or a container outside of the body. Passive drains do not need to be emptied. The most common type of passive drain is the Penrose drain. A drain is placed during surgery. Immediately after surgery, drainage is usually bright red and a little thicker than water. The drainage may gradually turn yellow or pink and become thinner. It is likely that your health care provider will remove the drain when the drainage stops or when the amount decreases to 1-2 Tbsp (15-30 mL) during a 24-hour period. How to care for your surgical drain  Keep the skin around the drain dry and covered with a dressing at all times.  Check your drain area every day for signs of infection. Check  for:  More redness, swelling, or pain.  Pus or a bad smell.  Cloudy drainage. Follow instructions from your health care provider about how to take care of your drain and how to change your dressing. Change your dressing at least one time every day. Change it more often if needed to keep the dressing dry. Make sure you: 1. Gather your supplies, including:  Tape.  Germ-free cleaning solution (sterile saline).  Split gauze drain sponge: 4 x 4 inches (10 x 10 cm).  Gauze square: 4 x 4 inches (10 x 10 cm). 2. Wash your hands with soap and water before you change your dressing. If soap and water are not available, use hand sanitizer. 3. Remove the old dressing. Avoid using scissors to do that. 4. Use sterile saline to clean your skin around the drain. 5. Place the tube through the slit  in a drain sponge. Place the drain sponge so that it covers your wound. 6. Place the gauze square or another drain sponge on top of the drain sponge that is on the wound. Make sure the tube is between those layers. 7. Tape the dressing to your skin. 8. If you have an active bulb or Hemovac drain, tape the drainage tube to your skin 1-2 inches (2.5-5 cm) below the place where the tube enters your body. Taping keeps the tube from pulling on any stitches (sutures) that you have. 9. Wash your hands with soap and water. 10. Write down the color of your drainage and how often you change your dressing. How to empty your active bulb or Hemovac drain 1. Make sure that you have a measuring cup that you can empty your drainage into. 2. Wash your hands with soap and water. If soap and water are not available, use hand sanitizer. 3. Gently move your fingers down the tube while squeezing very lightly. This is called stripping the tube. This clears any drainage, clots, or tissue from the tube.  Do not pull on the tube.  You may need to strip the tube several times every day to keep the tube clear. 4. Open the bulb cap or the drain plug. Do not touch the inside of the cap or the bottom of the plug. 5. Empty all of the drainage into the measuring cup. 6. Compress the bulb or the container and replace the cap or the plug. To compress the bulb or the container, squeeze it firmly in the middle while you close the cap or plug the container. 7. Write down the amount of drainage that you have in each 24-hour period. If you have less than 2 Tbsp (30 mL) of drainage during 24 hours, contact your health care provider. 8. Flush the drainage down the toilet. 9. Wash your hands with soap and water. Contact a health care provider if:  You have more redness, swelling, or pain around your drain area.  The amount of drainage that you have is increasing instead of decreasing.  You have pus or a bad smell coming from your  drain area.  You have a fever.  You have drainage that is cloudy.  There is a sudden stop or a sudden decrease in the amount of drainage that you have.  Your tube falls out.  Your active draindoes not stay compressedafter you empty it. This information is not intended to replace advice given to you by your health care provider. Make sure you discuss any questions you have with your health care provider. Document Released: 01/22/2000 Document  Revised: 07/02/2015 Document Reviewed: 08/13/2014  2017 Elsevier

## 2016-02-15 NOTE — Progress Notes (Signed)
Discharge planning, spoke with patient at beside. [facility]ed [facility]. [facility], [facility].  [phone]

## 2016-02-15 NOTE — Progress Notes (Signed)
[facility] Progress Note  1 Day Post-Op  Subjective: Pain controlled. Tolerating PO without N/V. Was advanced from clears to soft diet - first soft meal was this AM. Patient is having flatus and BMs. Ambulating.   Per GI Op note, ERCP yesterday showed no obvious bile leak but CBD stone was found. sphincterotomy performed and stent placed for drainage.   Significant drain output: 395/24h, serosanguinous   Objective: Vital signs in last 24 hours: Temp:  [97.5 F (36.4 C)-98.1 F (36.7 C)] 98 F (36.7 C) (01/08 0541) Pulse Rate:  [59-86] 59 (01/08 0541) Resp:  [14-18] 18 (01/08 0541) BP: (111-141)/(58-97) 111/93 (01/08 0541) SpO2:  [94 %-100 %] 98 % (01/08 0541) Last BM Date: 02/13/16  Intake/Output from previous day: 01/07 0701 - 01/08 0700 In: 1987.5 [I.V.:1987.5] Out: 395 [Drains:395] Intake/Output this shift: No intake/output data recorded.  PE: Gen:  Alert, NAD, pleasant Pulm:  CTA, unlabored Abd: Soft, appropriately tender, mild distention, hypoactive BS, incisions C/D/I, RUQ drain with moderate serosanguinous output, no bile appreciated. Ext:  No erythema, edema, or tenderness   Lab Results:   Recent Labs  02/14/16 0515 02/15/16 0512  WBC 6.3 6.3  HGB 11.2* 11.7*  HCT 33.2* 34.4*  PLT 251 272   BMET  Recent Labs  02/14/16 0515 02/15/16 0512  NA 138 137  K 3.9 4.0  CL 103 102  CO2 27 28  GLUCOSE 91 92  BUN 24* 16  CREATININE 0.98 0.93  CALCIUM 8.8* 8.8*   PT/INR No results for input(s): LABPROT, INR in the last 72 hours. CMP     Component Value Date/Time   NA 137 02/15/2016 0512   K 4.0 02/15/2016 0512   CL 102 02/15/2016 0512   CO2 28 02/15/2016 0512   GLUCOSE 92 02/15/2016 0512   BUN 16 02/15/2016 0512   CREATININE 0.93 02/15/2016 0512   CALCIUM 8.8 (L) 02/15/2016 0512   PROT 5.6 (L) 02/15/2016 0512   ALBUMIN 3.0 (L) 02/15/2016 0512   AST 32 02/15/2016 0512   ALT 42 02/15/2016 0512   ALKPHOS 97 02/15/2016 0512   BILITOT 0.5 02/15/2016 0512   GFRNONAA >60 02/15/2016 0512   GFRAA >60 02/15/2016 0512   Lipase     Component Value Date/Time   LIPASE 19 02/10/2016 1525   Studies/Results: Dg Ercp Biliary & Pancreatic Ducts  Result Date: 02/14/2016 CLINICAL DATA:  Status post laparoscopic cholecystectomy concern for postop bile leak. EXAM: ERCP with sphincterotomy and balloon stone extraction TECHNIQUE: Multiple spot images obtained with the fluoroscopic device and submitted for interpretation post-procedure. FLUOROSCOPY TIME:  Fluoroscopy Time: 3 minutes 21 seconds, 85.45 mGy Radiation Exposure Index (if provided by the fluoroscopic device): 85.45 mGy COMPARISON:  02/10/2016 FINDINGS: Numerous spot fluoroscopic intraoperative views performed during the ERCP with sphincterotomy and balloon extraction of choledocholithiasis. The limited imaging of the retrograde cholangiogram demonstrates mild biliary prominence. No visualized filling defect on the cholangiograms. Successful balloon sweep of the duct performed. Endoscopic temporary stent inserted into the common bile duct distally. No gross bile leak. IMPRESSION: Mild biliary dilatation without obstruction or gross bile leak. Sphincterotomy and balloon extraction performed for choledocholithiasis during the ERCP. Endoscopic stent successfully inserted. These images were submitted for radiologic interpretation only. Please see the procedural report for the amount of contrast and the fluoroscopy time utilized. Electronically Signed   By: Judie PetitM.  Shick M.D.   On: 02/14/2016 10:53    Anti-infectives: Anti-infectives    Start     Dose/Rate Route Frequency Ordered  Stop   02/10/16 2230  cefTRIAXone (ROCEPHIN) 2 g in dextrose 5 % 50 mL IVPB     2 g 100 mL/hr over 30 Minutes Intravenous Every 24 hours 02/10/16 2033       Assessment/Plan Status post lap cholecystectomy, drain placement by Dr. Maisie Fus - POD#4  S/P ERCP w/ sphincterotomy and stent placement 02/14/16 for  suspected bile leak; found choledocholithiasis   WBC and LFTs WNL Tolerating soft diet  High drain output: 395 cc/24 h  Consult to case management for [facility] drain care  Dispo: monitor drain output and discharge this afternoon vs tomorrow AM    LOS: 5 days    Adam Phenix , [facility] 02/15/2016, 10:25 AM Pager: [phone] Consults: [phone] Mon-Fri 7:00 am-4:30 pm Sat-Sun 7:00 am-11:30 am

## 2016-02-15 NOTE — Progress Notes (Signed)
Patient in room resting in bed.  Wife is visiting.  Patient has no complaints of pain.

## 2016-02-16 ENCOUNTER — Encounter (HOSPITAL_COMMUNITY): Payer: Self-pay | Admitting: Gastroenterology

## 2016-02-16 DIAGNOSIS — K8044 Calculus of bile duct with chronic cholecystitis without obstruction: Secondary | ICD-10-CM

## 2016-02-16 MED ORDER — SODIUM CHLORIDE 0.9 % IV SOLN: 250.0000 mL | INTRAVENOUS | Status: DC | PRN

## 2016-02-16 MED ORDER — SODIUM CHLORIDE 0.9% FLUSH: 3.0000 mL | INTRAVENOUS | Status: DC | PRN

## 2016-02-16 MED ORDER — SODIUM CHLORIDE 0.9% FLUSH
3.0000 mL | Freq: Two times a day (BID) | INTRAVENOUS | Status: DC
  Administered 2016-02-16: 3 mL via INTRAVENOUS

## 2016-02-16 NOTE — Progress Notes (Signed)
Discharge instructions  Discussed with patient and wife including Drain care, dressing change, emptying and recording jp drainage. Wife abe to change JP dressing and empty JP independently. Supplies given. IV discontinued. Am assessment otherwise unchanged.

## 2016-02-16 NOTE — Discharge Summary (Signed)
[facility] Discharge Summary   Patient ID: [name] MRN: [id number] DOB/AGE: 04-10-1970 45 y.o.  Admit date: 02/10/2016 Discharge date: 02/16/2016  Admitting Diagnosis: Acute cholecystitis  Discharge Diagnosis Patient Active Problem List   Diagnosis Date Noted  . Choledocholithiasis s/p ERCP & stent 02/14/2016 02/16/2016  . Heartburn 02/10/2016  . Acute gangrenous cholecystitis s/p lap cholecystectomy 02/11/2016 02/10/2016  . Tobacco abuse 02/10/2016  . Anxiousness 02/10/2016    Consultants Gastroenterology - Dr. Matthias Hughs, [facility]  Imaging: No results found.  Procedures Dr. Romie Levee (02/11/16) - Laparoscopic Cholecystectomy with 65F drain placement Dr. Vida Rigger (02/14/15) - ERCP with biliary sphincterotomy and balloon extraction; temporary stent placement  Hospital Course:  46 y/o male with history of tobacco use who presented to [facility] with epigastric/RUQ pain that started in 11/2014. He has presented to ED multiples times with similar pain, pain becoming increasingly worse since 01/2016. RUQ U/S significant for gallstones and gallbladder walk thickening. LFT's were normal. Patient was admitted, started on IV abx, and underwent laparoscopic cholecystectomy , during which time he was found to have a ruptured gallbladder. IOC was unable to be performed and a 65F drain was placed. Tolerated procedure well and was transferred to the floor. POD#1 drain significant for possible bilious output which became obviously bilious on POD#2. GI was consulted and ERCP was performed. This was significant for choledocholithiasis, no active bile leak. After the ERCP the patients diet was advanced as tolerated. His drain output was not significant for any more bile and gradually became more serous. On hospital day #6, the patient was voiding well, tolerating diet, ambulating well, pain well controlled, vital signs stable, incisions c/d/i and felt stable for discharge home. His surgical  drain will remain in place and will be removed in the [facility] office.  Patient will follow up in our office in 2 weeks and knows to call with questions or concerns.   Physical Exam: General:  Alert, NAD, pleasant, comfortable Abd:  Soft, non-tender, non-distended, incisions C/D/I, drain with minimal serosanguinous drainage (150 cc/24h recorded in epic)  Allergies as of 02/16/2016   No Known Allergies     Medication List    STOP taking these medications   dicyclomine 20 MG tablet Commonly known as:  BENTYL   meloxicam 15 MG tablet Commonly known as:  MOBIC   oxyCODONE-acetaminophen 5-325 MG tablet Commonly known as:  PERCOCET/ROXICET     TAKE these medications   acetaminophen 500 MG tablet Commonly known as:  TYLENOL Take 1,000 mg by mouth every 6 (six) hours as needed for headache.   famotidine-calcium carbonate-magnesium hydroxide 10-800-165 MG chewable tablet Commonly known as:  PEPCID COMPLETE Chew 1 tablet by mouth 3 (three) times daily before meals.   omeprazole 20 MG capsule Commonly known as:  PRILOSEC Take 1 capsule (20 mg total) by mouth daily. Take one tablet daily   oxyCODONE 5 MG immediate release tablet Commonly known as:  Oxy IR/ROXICODONE Take 1-2 tablets (5-10 mg total) by mouth every 4 (four) hours as needed for moderate pain, severe pain or breakthrough pain.      Follow-up Information    Vanita Panda., MD. Nyra Capes on 02/29/2016.   Specialty:  General Surgery Why:  at 10:10 AM for postoperative follow-up and possible drain removal. please arrive 30 minutes early to get checked in and fill out any necessary paperwork. Contact information: [address] [place] [address] [phone]        [facility]  Follow up.   Why:  nurse to assist with drain care Contact information: [address] [place] [address] [phone]        [facility], MD. Schedule an appointment as soon as possible for a visit.    Specialty:  Gastroenterology Why:  2-4 weeks Contact information: [address] [place] [address] [phone]           Signed: Hosie Spanglelizabeth Naijah Lacek, [facility] 02/16/2016, 12:47 PM Pager: [phone] Consults: [phone] Mon-Fri 7:00 am-4:30 pm Sat-Sun 7:00 am-11:30 am

## 2016-02-16 NOTE — Progress Notes (Signed)
[facility] Progress Note  Carlos DarkJulian Scott Morgan 46 y.o. 05/04/1970  CC:  Possible bile leak.   Subjective:  Patient feeling better. abdominal pain improving. Tolerating soft diet. Denied nausea, vomiting. Serosanguineous drainage noted in the right upper quadrant drain.   ROS : Negative for nausea and vomiting. Tolerating diet.   Objective: Vital signs in last 24 hours: Vitals:   02/15/16 2128 02/16/16 0631  BP: 105/61 96/69  Pulse: 71 67  Resp: 18 18  Temp: 97.9 F (36.6 C) 97.9 F (36.6 C)    Physical Exam:  General:  Alert, cooperative, no distress, appears stated age  Head:  Normocephalic, without obvious abnormality, atraumatic  Eyes:  , EOM's intact,   Lungs:   Clear to auscultation bilaterally, respirations unlabored  Heart:  Regular rate and rhythm, S1, S2 normal  Abdomen:   Mild epigastric and right upper quadrant discomfort. Mildly distended. Bowel sounds present. No peritoneal signs.,   Extremities: Extremities normal, atraumatic, no  edema  Pulses: 2+ and symmetric    Lab Results:  Recent Labs  02/14/16 0515 02/15/16 0512  NA 138 137  K 3.9 4.0  CL 103 102  CO2 27 28  GLUCOSE 91 92  BUN 24* 16  CREATININE 0.98 0.93  CALCIUM 8.8* 8.8*    Recent Labs  02/14/16 0515 02/15/16 0512  AST 16 32  ALT 26 42  ALKPHOS 68 97  BILITOT 0.3 0.5  PROT 6.0* 5.6*  ALBUMIN 2.9* 3.0*    Recent Labs  02/14/16 0515 02/15/16 0512  WBC 6.3 6.3  NEUTROABS 3.7 3.7  HGB 11.2* 11.7*  HCT 33.2* 34.4*  MCV 89.7 89.4  PLT 251 272   No results for input(s): LABPROT, INR in the last 72 hours.    Assessment/Plan: - Increased biliary output at right upper quadrant drainage status post complicated cholecystectomy concern for bile leak.Marland Kitchen. ERCP 02/14/2015  showed no obvious bile leak. Found to have CBD stone which was removed with a sphincterotomy. Status post  Stent Placement for drainage. - Status post laparoscopic  cholecystectomy.  Recommendations -------------------------- - Patient is afebrile. No leukocytosis. Tolerating diet. LFTs remains normal. - No further GI workup planned. GI will sign off. Call us back if needed. - Follow-up with Dr. Ewing SchleinMagod  in 2-4 weeks after discharge. Patient will need a repeat ERCP for stent removal in 4 to 6 weeks.  Kathi DerParag Quintyn Dombek MD, FACP 02/16/2016, 10:09 AM  Pager [phone]  If no answer or after 5 PM call [phone]

## 2016-04-01 ENCOUNTER — Other Ambulatory Visit: Payer: Self-pay | Admitting: Gastroenterology

## 2016-04-14 ENCOUNTER — Encounter (HOSPITAL_COMMUNITY): Payer: Self-pay | Admitting: *Deleted

## 2016-04-15 ENCOUNTER — Other Ambulatory Visit: Payer: Self-pay | Admitting: Gastroenterology

## 2016-04-19 ENCOUNTER — Ambulatory Visit (HOSPITAL_COMMUNITY)
Admission: RE | Admit: 2016-04-19 | Discharge: 2016-04-19 | Disposition: A | Discharge status: Home / Self Care | Payer: BLUE CROSS/BLUE SHIELD | Source: Ambulatory Visit | Attending: Gastroenterology | Admitting: Gastroenterology

## 2016-04-19 ENCOUNTER — Encounter (HOSPITAL_COMMUNITY): Payer: Self-pay

## 2016-04-19 ENCOUNTER — Ambulatory Visit (HOSPITAL_COMMUNITY): Payer: BLUE CROSS/BLUE SHIELD | Admitting: Anesthesiology

## 2016-04-19 ENCOUNTER — Encounter (HOSPITAL_COMMUNITY)
Admission: RE | Disposition: A | Discharge status: Home / Self Care | Payer: Self-pay | Source: Ambulatory Visit | Attending: Gastroenterology

## 2016-04-19 DIAGNOSIS — F17210 Nicotine dependence, cigarettes, uncomplicated: Secondary | ICD-10-CM | POA: Diagnosis not present

## 2016-04-19 DIAGNOSIS — Z4659 Encounter for fitting and adjustment of other gastrointestinal appliance and device: Secondary | ICD-10-CM | POA: Insufficient documentation

## 2016-04-19 DIAGNOSIS — T183XXA Foreign body in small intestine, initial encounter: Secondary | ICD-10-CM | POA: Diagnosis present

## 2016-04-19 HISTORY — PX: GASTROINTESTINAL STENT REMOVAL: SHX6384

## 2016-04-19 HISTORY — PX: ESOPHAGOGASTRODUODENOSCOPY (EGD) WITH PROPOFOL: SHX5813

## 2016-04-19 SURGERY — ESOPHAGOGASTRODUODENOSCOPY (EGD) WITH PROPOFOL
Anesthesia: Monitor Anesthesia Care

## 2016-04-19 MED ORDER — MIDAZOLAM HCL 2 MG/2ML IJ SOLN
INTRAMUSCULAR | Status: AC
  Filled 2016-04-19: qty 2

## 2016-04-19 MED ORDER — PROPOFOL 10 MG/ML IV BOLUS
INTRAVENOUS | Status: AC
  Filled 2016-04-19: qty 20

## 2016-04-19 MED ORDER — SODIUM CHLORIDE 0.9 % IV SOLN: INTRAVENOUS | Status: DC

## 2016-04-19 MED ORDER — GLYCOPYRROLATE 0.2 MG/ML IJ SOLN
INTRAMUSCULAR | Status: DC | PRN
  Administered 2016-04-19: 0.2 mg via INTRAVENOUS

## 2016-04-19 MED ORDER — PROPOFOL 500 MG/50ML IV EMUL
INTRAVENOUS | Status: DC | PRN
  Administered 2016-04-19: 50 mg via INTRAVENOUS

## 2016-04-19 MED ORDER — PROPOFOL 10 MG/ML IV BOLUS
INTRAVENOUS | Status: AC
  Filled 2016-04-19: qty 40

## 2016-04-19 MED ORDER — LACTATED RINGERS IV SOLN
INTRAVENOUS | Status: DC
  Administered 2016-04-19: 1000 mL via INTRAVENOUS
  Administered 2016-04-19: 13:00:00 via INTRAVENOUS

## 2016-04-19 MED ORDER — PROPOFOL 500 MG/50ML IV EMUL
INTRAVENOUS | Status: DC | PRN
  Administered 2016-04-19: 150 ug/kg/min via INTRAVENOUS

## 2016-04-19 MED ORDER — MIDAZOLAM HCL 5 MG/5ML IJ SOLN
INTRAMUSCULAR | Status: DC | PRN
  Administered 2016-04-19: 2 mg via INTRAVENOUS

## 2016-04-19 SURGICAL SUPPLY — 14 items

## 2016-04-19 NOTE — Transfer of Care (Signed)
Immediate Anesthesia Transfer of Care Note  Patient: Nickolus Wadding  Procedure(s) Performed: Procedure(s) with comments: ESOPHAGOGASTRODUODENOSCOPY (EGD) WITH PROPOFOL (N/A) - with ercp scope GASTROINTESTINAL STENT REMOVAL (N/A)  Patient Location: PACU  Anesthesia Type:MAC  Level of Consciousness:  sedated, patient cooperative and responds to stimulation  Airway & Oxygen Therapy:Patient Spontanous Breathing and Patient connected to face mask oxgen  Post-op Assessment:  Report given to PACU RN and Post -op Vital signs reviewed and stable  Post vital signs:  Reviewed and stable  Last Vitals:  Vitals:   04/19/16 1219 04/19/16 1327  BP: (!) 126/92 138/84  Pulse: (!) 53 80  Resp: 15 18  Temp: 36.6 C 93.7 C    Complications: No apparent anesthesia complications

## 2016-04-19 NOTE — Anesthesia Preprocedure Evaluation (Signed)

## 2016-04-19 NOTE — Anesthesia Postprocedure Evaluation (Signed)
Anesthesia Post Note  Patient: [name]  Procedure(s) Performed: Procedure(s) (LRB): ESOPHAGOGASTRODUODENOSCOPY (EGD) WITH PROPOFOL (N/A) GASTROINTESTINAL STENT REMOVAL (N/A)  Patient location during evaluation: PACU Anesthesia Type: MAC Level of consciousness: awake and alert Pain management: pain level controlled Vital Signs Assessment: post-procedure vital signs reviewed and stable Respiratory status: spontaneous breathing, nonlabored ventilation, respiratory function stable and patient connected to nasal cannula oxygen Cardiovascular status: stable and blood pressure returned to baseline Anesthetic complications: no       Last Vitals:  Vitals:   04/19/16 1350 04/19/16 1355  BP: (!) 125/109 (!) 131/99  Pulse: 88 78  Resp: 19 16  Temp:      Last Pain:  Vitals:   04/19/16 1327  TempSrc: Oral                 [name]

## 2016-04-19 NOTE — Op Note (Signed)
[facility] Patient Name: [name] Procedure Date: 04/19/2016 MRN: [id number] Attending MD: Vida Rigger , MD Date of Birth: [birth year] CSN: [id number] Age: 46 Admit Type: Outpatient Procedure:                Upper GI endoscopy Indications:              Foreign body in the small bowel status post biliary                            stent placement for bile leak in early January Providers:                Vida Rigger, MD, Janae Sauce. Steele Berg, RN, Lorenda Ishihara,                            Technician, [employer], CRNA Referring MD:              Medicines:                Midazolam 2 mg IV, Propofol total dose 250 mg IV Complications:            No immediate complications. Estimated Blood Loss:     Estimated blood loss was minimal. Procedure:                Pre-Anesthesia Assessment:                           - Prior to the procedure, a History and Physical                            was performed, and patient medications and                            allergies were reviewed. The patient's tolerance of                            previous anesthesia was also reviewed. The risks                            and benefits of the procedure and the sedation                            options and risks were discussed with the patient.                            All questions were answered, and informed consent                            was obtained. Prior Anticoagulants: The patient has                            taken no previous anticoagulant or antiplatelet                            agents. ASA Grade Assessment: I - A normal, healthy  patient. After reviewing the risks and benefits,                            the patient was deemed in satisfactory condition to                            undergo the procedure.                           After obtaining informed consent, the endoscope was                            passed under direct vision. Throughout the                        procedure, the patient's blood pressure, pulse, and                            oxygen saturations were monitored continuously. The                            [id number] ([id number]) scope was introduced through                            the mouth, and advanced to the second part of                            duodenum. using the side-viewing ERCP scope no                            obvious abnormality was seen but we advanced the                            scope quickly to the ampulla The upper GI endoscopy                            was accomplished without difficulty. The patient                            tolerated the procedure well. Scope In: Scope Out: Findings:      A previously placed plastic stent was seen in the area of the papilla.       Stent removal was accomplished with a snare initially we tried to grab       it with the Raptor grabber but could not align it with the stent so we       switched to the snare and the stent was easily grabbed but the Raptor       did Stir up minimal bleeding when we tried to grab the stent and grabbed       the adjacent mucosal instead.      The exam was otherwise without abnormality on quick evaluation using the       side-viewing scope. Impression:               - Plastic stent in the duodenum/ampulla. Removed.                           -  The examination was otherwise normal on quick                            evaluation as above. Moderate Sedation:      N/A- Per Anesthesia Care Recommendation:           - Patient has a contact number available for                            emergencies. The signs and symptoms of potential                            delayed complications were discussed with the                            patient. Return to normal activities tomorrow.                            Written discharge instructions were provided to the                            patient.                           - Patient has a  contact number available for                            emergencies. The signs and symptoms of potential                            delayed complications were discussed with the                            patient. Return to normal activities tomorrow.                            Written discharge instructions were provided to the                            patient.                           - Soft diet today.                           - Continue present medications.                           - Return to GI clinic PRN.                           - Telephone GI clinic if symptomatic PRN. Procedure Code(s):        --- Professional ---                           [id number], Esophagogastroduodenoscopy, flexible,  transoral; with removal of foreign body(s) Diagnosis Code(s):        --- Professional ---                           Z46.59, Encounter for fitting and adjustment of                            other gastrointestinal appliance and device                           T18.3XXA, Foreign body in small intestine, initial                            encounter CPT copyright 2016 American [employer] Association. All rights reserved. The codes documented in this report are preliminary and upon coder review may  be revised to meet current compliance requirements. Vida RiggerMarc Kimberly Coye, MD 04/19/2016 1:32:11 PM This report has been signed electronically. Number of Addenda: 0

## 2016-04-19 NOTE — Progress Notes (Signed)
[name] 12:41 PM    Subjective:                              Patient without any complaints and no problems since we last saw him and case discussed with his wife as well   Objective: Vital signs stable afebrile no acute distress exam please see preassessment evaluation  Assessment: Bile leak status post stenting Plan: Okay to proceed with endoscopy and stent removal with anesthesia assistance and the procedure was rediscussed with the patient and his wife  [facility] HospitalMAGOD,[name]  Pager [phone] After 5PM or if no answer call [phone]

## 2016-04-19 NOTE — Discharge Instructions (Signed)
Call if question or problem particularly if increased pain fever nausea vomiting or signs of obvious GI bleeding or black diarrhea otherwise soft solids today and may slowly advance diet tomorrow and follow-up with me as needed from a GI standpoint

## 2016-04-20 ENCOUNTER — Encounter (HOSPITAL_COMMUNITY): Payer: Self-pay | Admitting: Gastroenterology

## 2016-08-19 NOTE — Addendum Note (Signed)
Addendum  created 08/19/16 1442 by Deloy Archey, MD   Sign clinical note    

## 2016-08-19 NOTE — Anesthesia Postprocedure Evaluation (Signed)
Anesthesia Post Note  Patient: Ardith DarkJulian Scott Mikles  Procedure(s) Performed: Procedure(s) (LRB): ESOPHAGOGASTRODUODENOSCOPY (EGD) WITH PROPOFOL (N/A) GASTROINTESTINAL STENT REMOVAL (N/A)     Anesthesia Post Evaluation  Last Vitals:  Vitals:   04/19/16 1350 04/19/16 1355  BP: (!) 125/109 (!) 131/99  Pulse: 88 78  Resp: 19 16  Temp:      Last Pain:  Vitals:   04/19/16 1327  TempSrc: Oral                 Jessamine Barcia EDWARD

## 2018-01-27 IMAGING — US US ABDOMEN LIMITED
1 series · 14 of 25 positions shown · non-contrast
Comparison: Ultrasound 12/01/2014.

CLINICAL DATA: Right upper quadrant pain.

EXAM:
US ABDOMEN LIMITED - RIGHT UPPER QUADRANT

[Series 1: us abdomen limited · 0.25mm/px · 14 of 52 slices shown]
[im 1/52]
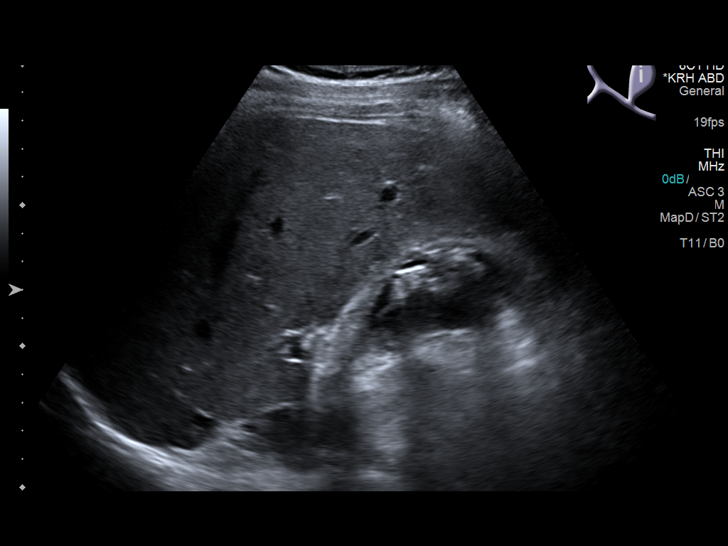
[im 5/52]
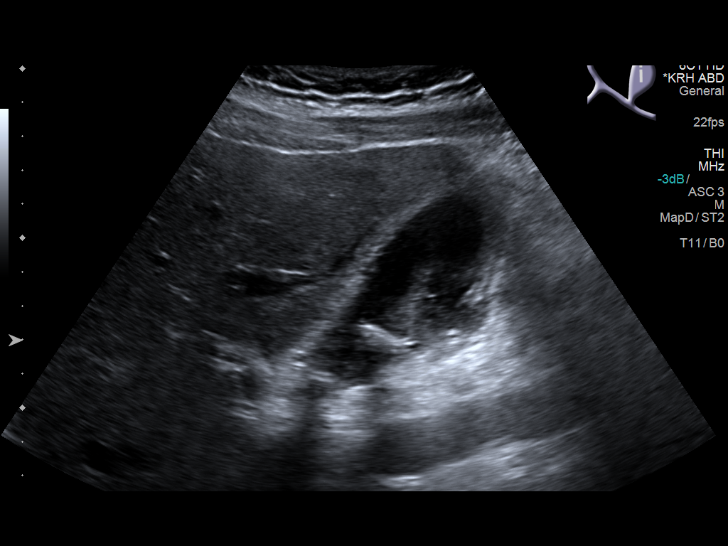
[im 9/52]
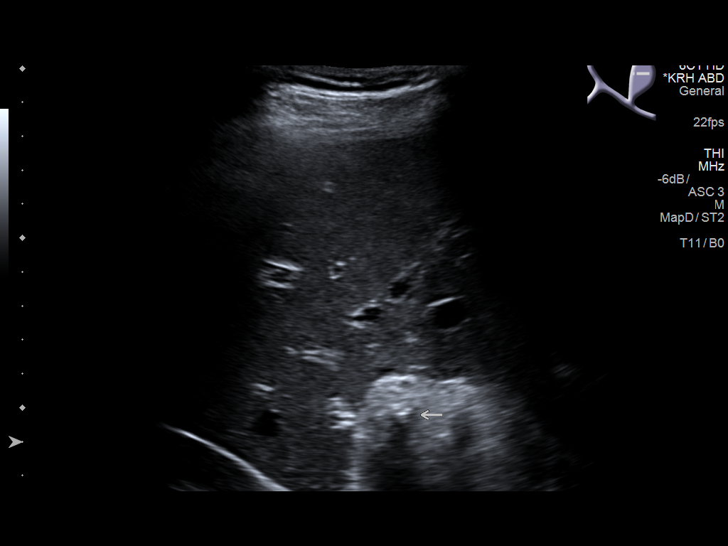
[im 13/52]
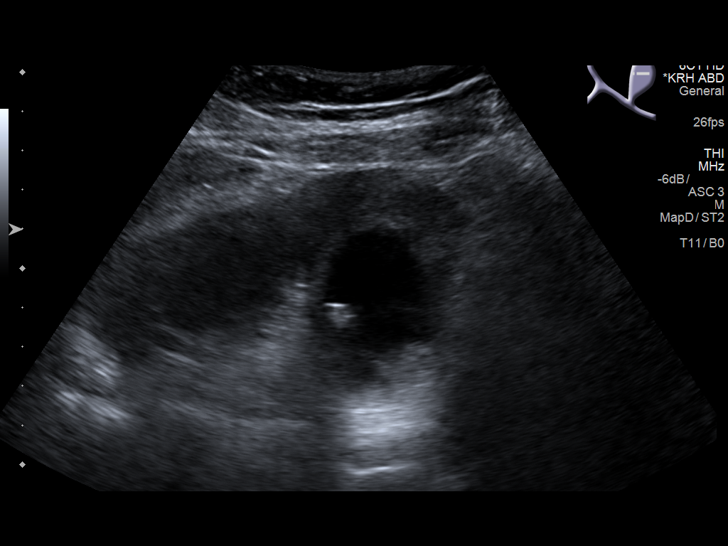
[im 18/52]
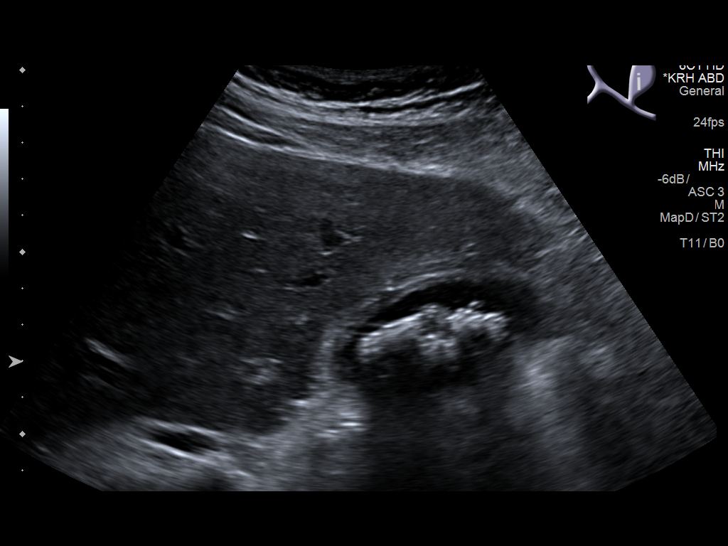
[im 20/52]
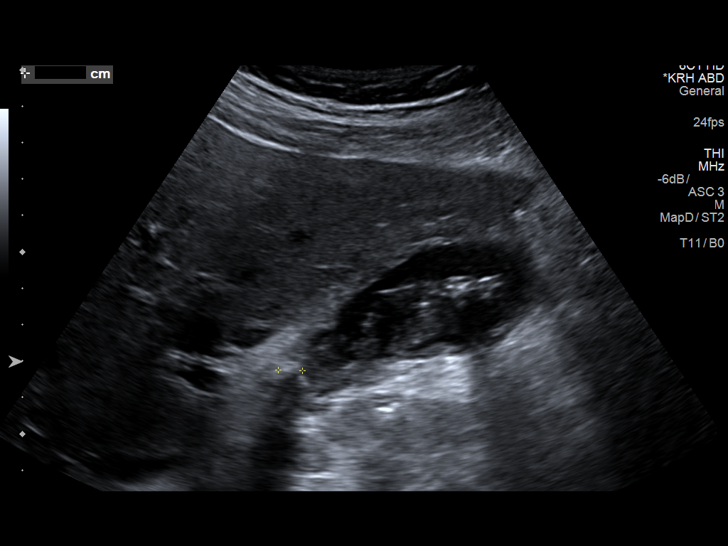
[im 24/52]
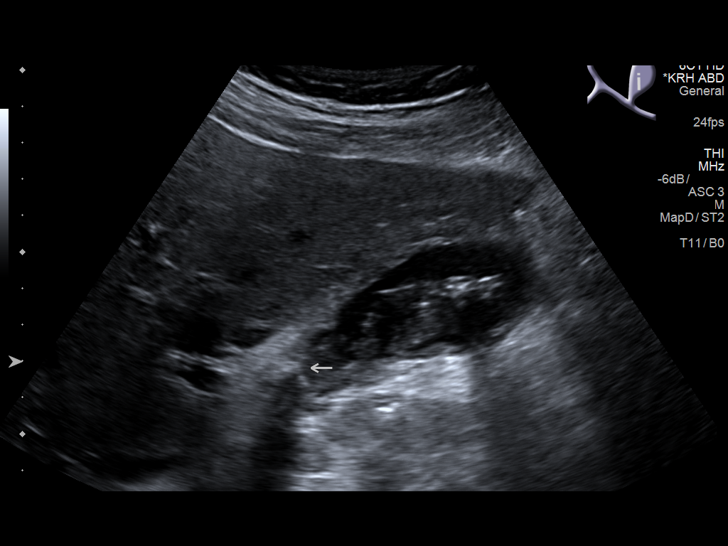
[im 28/52]
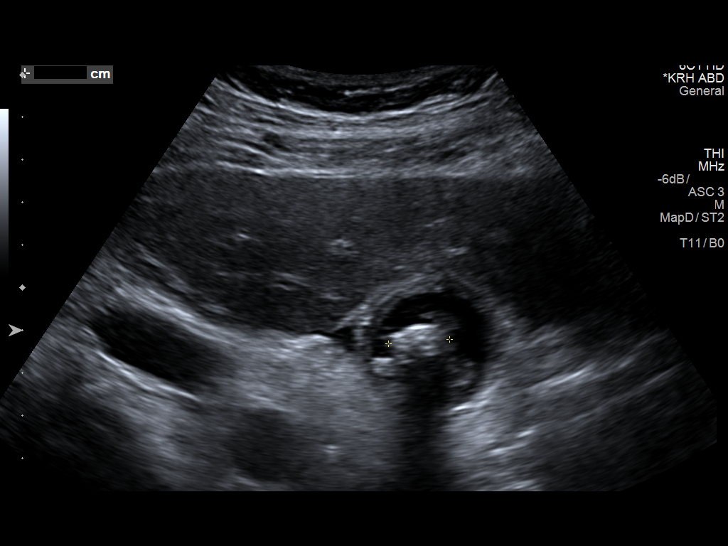
[im 32/52]
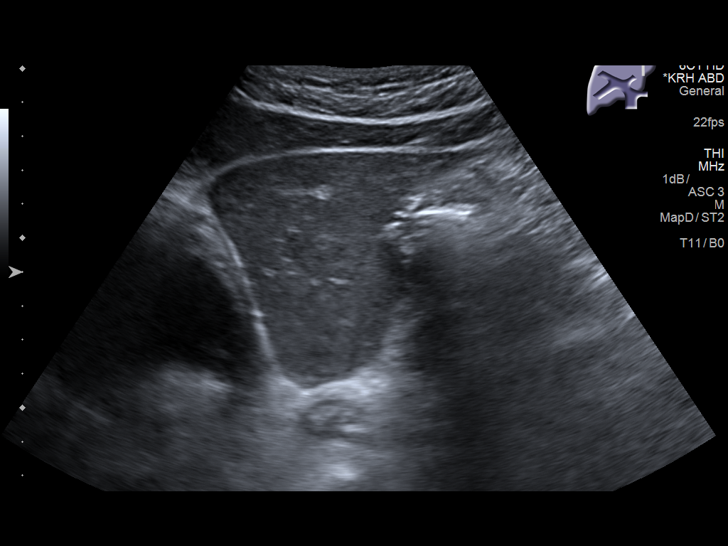
[im 35/52]
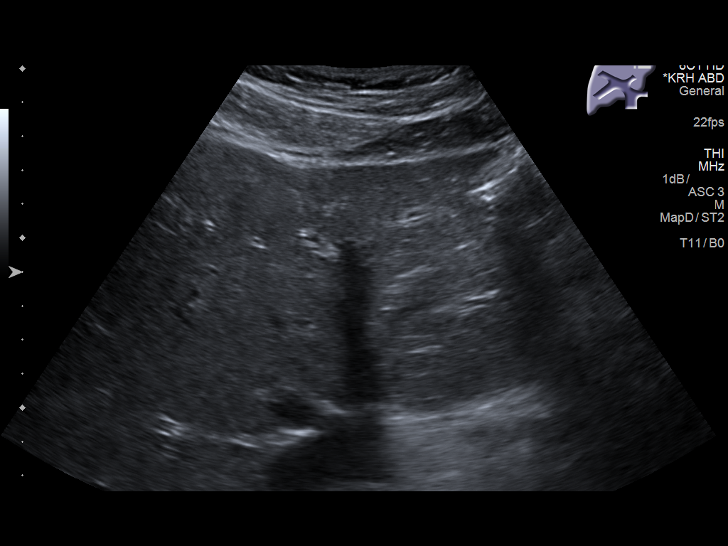
[im 39/52]
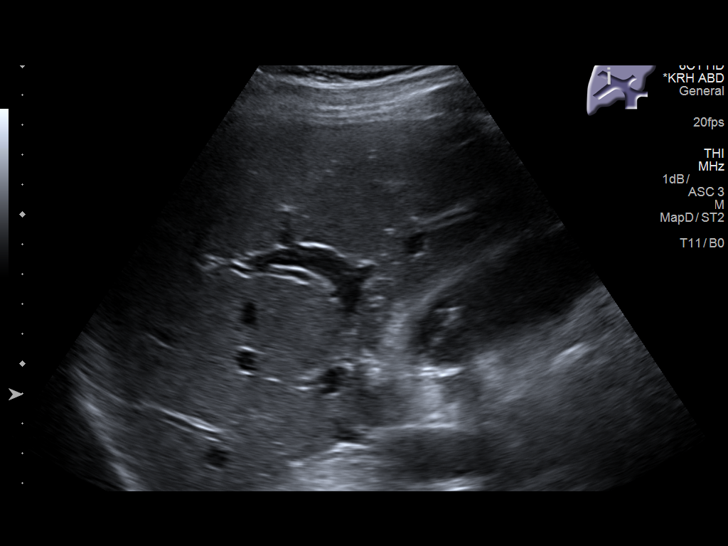
[im 43/52]
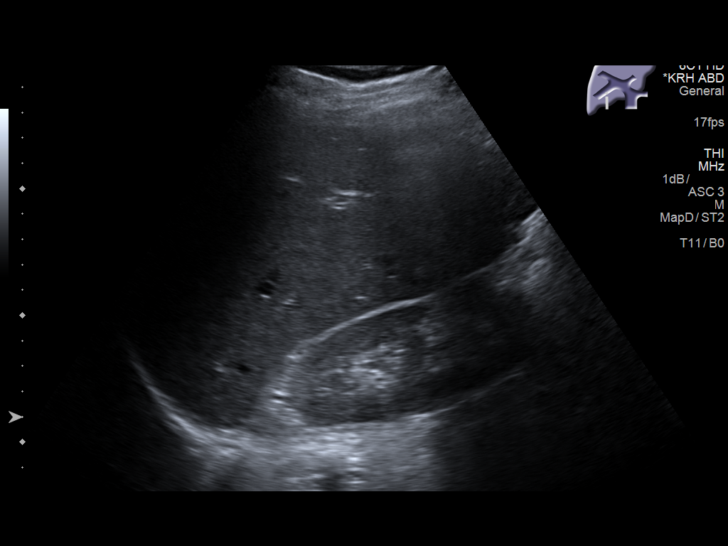
[im 47/52]
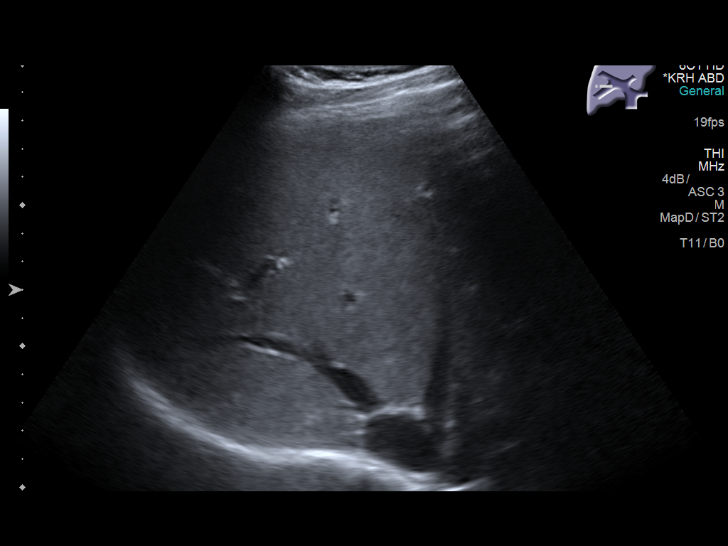
[im 52/52]
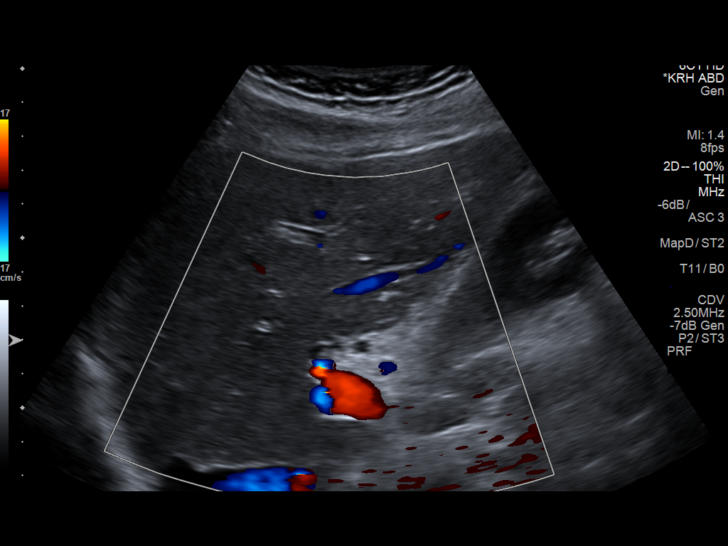

[14 of 25 positions shown; findings below may reference images not displayed]

FINDINGS: Gallbladder:

Sludge and multiple gallstones noted. Largest gallstone measures
cm. 6 mm mobile stone noted in the gallbladder neck. Gallbladder
wall thickening to 4 mm noted.

Common bile duct:

Diameter: 4 mm.

Liver:

There is echogenic consistent fatty infiltration and/or
hepatocellular disease. Mild intrahepatic biliary ductal dilatation
noted.
IMPRESSION: 1. Sludge and multiple gallstones noted. The largest gallstone
measures 1.5 cm. A 6 mm immobile gallstone is noted in the
gallbladder neck. Gallbladder wall thickening to 4 mm noted.
Cholecystitis cannot be excluded.

2. Common bile duct is normal caliber 4 mm. Mild intrahepatic ductal
dilatation. MRCP can be obtained for further evaluation.

## 2018-08-23 ENCOUNTER — Emergency Department (HOSPITAL_COMMUNITY)
Admission: EM | Admit: 2018-08-23 | Discharge: 2018-08-24 | Disposition: A | Discharge status: Home / Self Care | Payer: Self-pay | Attending: Emergency Medicine | Admitting: Emergency Medicine

## 2018-08-23 ENCOUNTER — Other Ambulatory Visit: Payer: Self-pay

## 2018-08-23 ENCOUNTER — Emergency Department (HOSPITAL_COMMUNITY): Payer: Self-pay

## 2018-08-23 ENCOUNTER — Encounter (HOSPITAL_COMMUNITY): Payer: Self-pay

## 2018-08-23 DIAGNOSIS — R072 Precordial pain: Secondary | ICD-10-CM | POA: Insufficient documentation

## 2018-08-23 DIAGNOSIS — F17210 Nicotine dependence, cigarettes, uncomplicated: Secondary | ICD-10-CM | POA: Insufficient documentation

## 2018-08-23 DIAGNOSIS — R062 Wheezing: Secondary | ICD-10-CM | POA: Insufficient documentation

## 2018-08-23 DIAGNOSIS — Z20828 Contact with and (suspected) exposure to other viral communicable diseases: Secondary | ICD-10-CM | POA: Insufficient documentation

## 2018-08-23 LAB — COMPREHENSIVE METABOLIC PANEL
ALT: 19 U/L (ref 0–44)
AST: 21 U/L (ref 15–41)
Albumin: 4 g/dL (ref 3.5–5.0)
Alkaline Phosphatase: 75 U/L (ref 38–126)
Anion gap: 8 (ref 5–15)
BUN: 19 mg/dL (ref 6–20)
CO2: 26 mmol/L (ref 22–32)
Calcium: 9.2 mg/dL (ref 8.9–10.3)
Chloride: 104 mmol/L (ref 98–111)
Creatinine, Ser: 1 mg/dL (ref 0.61–1.24)
GFR calc Af Amer: >60 mL/min (ref >60)
GFR calc non Af Amer: >60 mL/min (ref >60)
Glucose, Bld: 104 mg/dL — ABNORMAL HIGH (ref 70–99)
Potassium: 4.7 mmol/L (ref 3.5–5.1)
Sodium: 138 mmol/L (ref 135–145)
Total Bilirubin: 0.6 mg/dL (ref 0.3–1.2)
Total Protein: 6.4 g/dL — ABNORMAL LOW (ref 6.5–8.1)

## 2018-08-23 LAB — CBC
HCT: 45.3 % (ref 39.0–52.0)
Hemoglobin: 14.4 g/dL (ref 13.0–17.0)
MCH: 29.4 pg (ref 26.0–34.0)
MCHC: 31.8 g/dL (ref 30.0–36.0)
MCV: 92.6 fL (ref 80.0–100.0)
Platelets: 195 10*3/uL (ref 150–400)
RBC: 4.89 MIL/uL (ref 4.22–5.81)
RDW: 13.3 % (ref 11.5–15.5)
WBC: 8.3 10*3/uL (ref 4.0–10.5)
nRBC: 0 % (ref 0.0–0.2)

## 2018-08-23 LAB — TROPONIN I (HIGH SENSITIVITY): Troponin I (High Sensitivity): 6 ng/L (ref <18)

## 2018-08-23 LAB — SARS CORONAVIRUS 2 BY RT PCR (HOSPITAL ORDER, PERFORMED IN ~~LOC~~ HOSPITAL LAB): SARS Coronavirus 2: NEGATIVE

## 2018-08-23 MED ORDER — ALBUTEROL SULFATE HFA 108 (90 BASE) MCG/ACT IN AERS
4.0000 | INHALATION_SPRAY | RESPIRATORY_TRACT | Status: DC | PRN
  Administered 2018-08-23: 22:00:00 4 via RESPIRATORY_TRACT
  Filled 2018-08-23: qty 6.7

## 2018-08-23 MED ORDER — SODIUM CHLORIDE 0.9 % IV BOLUS
500.0000 mL | Freq: Once | INTRAVENOUS | Status: AC
  Administered 2018-08-23: 21:00:00 500 mL via INTRAVENOUS

## 2018-08-23 MED ORDER — IPRATROPIUM BROMIDE HFA 17 MCG/ACT IN AERS
2.0000 | INHALATION_SPRAY | Freq: Once | RESPIRATORY_TRACT | Status: AC
  Administered 2018-08-23: 2 via RESPIRATORY_TRACT
  Filled 2018-08-23: qty 12.9

## 2018-08-23 MED ORDER — PREDNISONE 20 MG PO TABS: 60.0000 mg | ORAL_TABLET | Freq: Every day | ORAL | 0 refills | Status: AC

## 2018-08-23 MED ORDER — PREDNISONE 20 MG PO TABS
60.0000 mg | ORAL_TABLET | Freq: Once | ORAL | Status: AC
Start: 2018-08-23 — End: 2018-08-23
  Administered 2018-08-23: 60 mg via ORAL

## 2018-08-23 MED ORDER — ALBUTEROL SULFATE HFA 108 (90 BASE) MCG/ACT IN AERS
4.0000 | INHALATION_SPRAY | Freq: Once | RESPIRATORY_TRACT | Status: AC
  Administered 2018-08-23: 4 via RESPIRATORY_TRACT

## 2018-08-23 MED ORDER — ALBUTEROL SULFATE HFA 108 (90 BASE) MCG/ACT IN AERS: 2.0000 | INHALATION_SPRAY | RESPIRATORY_TRACT | 1 refills | Status: AC | PRN

## 2018-08-23 NOTE — ED Triage Notes (Signed)
Pt from [facility] with c/o CP and SOB x 3 wks; pt noticed BLE edema 3wks ago but it went away and CP w/ Sob was intermittent; today pain became worse, and SOB worsened with movement; CP is centralized, radiating to LA; EMS noted rhonci/wheezing. Pt rec'd 2 nitro and 324mg  of ASA; sating 93% on RA; placed on 2L via [place]; 118/78; 55; 18; 98% on 2L; no CBG obtained

## 2018-08-23 NOTE — ED Provider Notes (Signed)
[facility] Provider Note   CSN: [id number] Arrival date & time: 08/23/18  2016     History   Chief Complaint Chief Complaint  Patient presents with  . Chest Pain  . Shortness of Breath    HPI [name] is a 48 y.o. male.     Patient c/o mid chest heaviness for past 3 days. Also with non prod cough and increased sob. Symptoms acute onset, moderate, persistent, constant, slowly worse. +smoker. No hx cad or fam hx cad. Hx reactive airway disease, wheezing, uses to use inhaler prn. Denies leg pain or swelling. No hx dvt or pe. Denies fever or chills. No known covid + exposure.   The history is provided by the patient.  Chest Pain Associated symptoms: cough and shortness of breath   Associated symptoms: no abdominal pain, no back pain, no fever, no headache and no vomiting   Shortness of Breath Associated symptoms: chest pain and cough   Associated symptoms: no abdominal pain, no fever, no headaches, no neck pain, no rash, no sore throat and no vomiting     Past Medical History:  Diagnosis Date  . Medical history non-contributory     Patient Active Problem List   Diagnosis Date Noted  . Choledocholithiasis s/p ERCP & stent 02/14/2016 02/16/2016  . Heartburn 02/10/2016  . Acute gangrenous cholecystitis s/p lap cholecystectomy 02/11/2016 02/10/2016  . Tobacco abuse 02/10/2016  . Anxiousness 02/10/2016    Past Surgical History:  Procedure Laterality Date  . CHOLECYSTECTOMY N/A 02/11/2016   Procedure: LAPAROSCOPIC CHOLECYSTECTOMY;  Surgeon: Romie LeveeAlicia Thomas, MD;  Location: [facility] ORS;  Service: General;  Laterality: N/A;  . ENDOSCOPIC RETROGRADE CHOLANGIOPANCREATOGRAPHY (ERCP) WITH PROPOFOL N/A 02/14/2016   Procedure: ENDOSCOPIC RETROGRADE CHOLANGIOPANCREATOGRAPHY (ERCP) WITH PROPOFOL;  Surgeon: Vida RiggerMarc [name], MD;  Location: [facility] ENDOSCOPY;  Service: Endoscopy;  Laterality: N/A;  . ESOPHAGOGASTRODUODENOSCOPY (EGD) WITH PROPOFOL N/A 04/19/2016   Procedure: ESOPHAGOGASTRODUODENOSCOPY (EGD) WITH PROPOFOL;  Surgeon: Vida RiggerMarc [name], MD;  Location: [facility] ENDOSCOPY;  Service: Endoscopy;  Laterality: N/A;  with ercp scope  . GASTROINTESTINAL STENT REMOVAL N/A 04/19/2016   Procedure: GASTROINTESTINAL STENT REMOVAL;  Surgeon: Vida RiggerMarc [name], MD;  Location: [facility] ENDOSCOPY;  Service: Endoscopy;  Laterality: N/A;  . NO PAST SURGERIES          Home Medications    Prior to Admission medications   Medication Sig Start Date End Date Taking? Authorizing Provider  acetaminophen (TYLENOL) 500 MG tablet Take 1,000 mg by mouth every 6 (six) hours as needed for headache.    [provider]    Family History Family History  Problem Relation Age of Onset  . Hypertension Mother   . Hypertension Maternal Grandmother   . Diabetes Maternal Grandmother   . Heart disease Paternal Grandfather   . Diabetes Paternal Grandfather     Social History Social History   Tobacco Use  . Smoking status: Current Every Day Smoker    Packs/day: 1.50    Years: 20.00    Pack years: 30.00    Types: Cigarettes  . Smokeless tobacco: Never Used  Substance Use Topics  . Alcohol use: No  . Drug use: No     Allergies   Patient has no known allergies.   Review of Systems Review of Systems  Constitutional: Negative for fever.  HENT: Negative for sore throat.   Eyes: Negative for redness.  Respiratory: Positive for cough and shortness of breath.   Cardiovascular: Positive for chest pain. Negative for leg swelling.  Gastrointestinal: Negative for abdominal pain, diarrhea and vomiting.  Genitourinary: Negative for flank pain.  Musculoskeletal: Negative for back pain and neck pain.  Skin: Negative for rash.  Neurological: Negative for headaches.  Hematological: Does not bruise/bleed easily.  Psychiatric/Behavioral: Negative for confusion.     Physical Exam Updated Vital Signs BP 117/76 (BP Location: Left Arm)   Pulse (!) 49   Temp 97.7 F (36.5 C) (Oral)    Resp 17   SpO2 93%   Physical Exam Vitals signs and nursing note reviewed.  Constitutional:      Appearance: Normal appearance. He is well-developed.  HENT:     Head: Atraumatic.     Nose: Nose normal.     Mouth/Throat:     Mouth: Mucous membranes are moist.     Pharynx: Oropharynx is clear.  Eyes:     General: No scleral icterus.    Conjunctiva/sclera: Conjunctivae normal.  Neck:     Musculoskeletal: Normal range of motion and neck supple. No neck rigidity.     Trachea: No tracheal deviation.  Cardiovascular:     Rate and Rhythm: Normal rate and regular rhythm.     Pulses: Normal pulses.     Heart sounds: Normal heart sounds. No murmur. No friction rub. No gallop.   Pulmonary:     Effort: Pulmonary effort is normal. No accessory muscle usage or respiratory distress.     Breath sounds: Wheezing, rhonchi and rales present.  Abdominal:     General: Bowel sounds are normal. There is no distension.     Palpations: Abdomen is soft.     Tenderness: There is no abdominal tenderness. There is no guarding.  Genitourinary:    Comments: No cva tenderness. Musculoskeletal:        General: No swelling or tenderness.  Skin:    General: Skin is warm and dry.     Findings: No rash.  Neurological:     Mental Status: He is alert.     Comments: Alert, speech clear.   Psychiatric:        Mood and Affect: Mood normal.      ED Treatments / Results  Labs (all labs ordered are listed, but only abnormal results are displayed) Results for orders placed or performed during the hospital encounter of 08/23/18  SARS Coronavirus 2 (CEPHEID- Performed in [facility] lab), [facility] Order   Specimen: Nasopharyngeal Swab  Result Value Ref Range   SARS Coronavirus 2 NEGATIVE NEGATIVE  CBC  Result Value Ref Range   WBC 8.3 4.0 - 10.5 K/uL   RBC 4.89 4.22 - 5.81 MIL/uL   Hemoglobin 14.4 13.0 - 17.0 g/dL   HCT 16.145.3 09.639.0 - 04.552.0 %   MCV 92.6 80.0 - 100.0 fL   MCH 29.4 26.0 - 34.0 pg    MCHC 31.8 30.0 - 36.0 g/dL   RDW 40.913.3 81.111.5 - 91.415.5 %   Platelets 195 150 - 400 K/uL   nRBC 0.0 0.0 - 0.2 %  Comprehensive metabolic panel  Result Value Ref Range   Sodium 138 135 - 145 mmol/L   Potassium 4.7 3.5 - 5.1 mmol/L   Chloride 104 98 - 111 mmol/L   CO2 26 22 - 32 mmol/L   Glucose, Bld 104 (H) 70 - 99 mg/dL   BUN 19 6 - 20 mg/dL   Creatinine, Ser 7.821.00 0.61 - 1.24 mg/dL   Calcium 9.2 8.9 - 95.610.3 mg/dL   Total Protein 6.4 (L) 6.5 - 8.1 g/dL  Albumin 4.0 3.5 - 5.0 g/dL   AST 21 15 - 41 U/L   ALT 19 0 - 44 U/L   Alkaline Phosphatase 75 38 - 126 U/L   Total Bilirubin 0.6 0.3 - 1.2 mg/dL   GFR calc non Af Amer >60 >60 mL/min   GFR calc Af Amer >60 >60 mL/min   Anion gap 8 5 - 15  Troponin I (High Sensitivity)  Result Value Ref Range   Troponin I (High Sensitivity) 6 <18 ng/L   Dg Chest Port 1 View  Result Date: 08/23/2018 CLINICAL DATA:  Reason for exam: cough Pt c/o CP and SOB x 3 wks; today pain became worse, and SOB worsened with movement; CP is centralized, radiating to left arm EXAM: PORTABLE CHEST 1 VIEW COMPARISON:  None. FINDINGS: The heart size and mediastinal contours are within normal limits. Both lungs are clear. The visualized skeletal structures are unremarkable. IMPRESSION: No active disease. Electronically Signed   By: Norva PavlovElizabeth  Brown M.D.   On: 08/23/2018 21:06    EKG EKG Interpretation  Date/Time:  Thursday August 23 2018 20:25:55 EDT Ventricular Rate:  56 PR Interval:    QRS Duration: 115 QT Interval:  457 QTC Calculation: 442 R Axis:   75 Text Interpretation:  Sinus rhythm Nonspecific ST abnormality Confirmed by Cathren LaineSteinl, Cintya Daughety ([id number]) on 08/23/2018 9:24:44 PM   Radiology Dg Chest Port 1 View  Result Date: 08/23/2018 CLINICAL DATA:  Reason for exam: cough Pt c/o CP and SOB x 3 wks; today pain became worse, and SOB worsened with movement; CP is centralized, radiating to left arm EXAM: PORTABLE CHEST 1 VIEW COMPARISON:  None. FINDINGS: The heart size and  mediastinal contours are within normal limits. Both lungs are clear. The visualized skeletal structures are unremarkable. IMPRESSION: No active disease. Electronically Signed   By: Norva PavlovElizabeth  Brown M.D.   On: 08/23/2018 21:06    Procedures Procedures (including critical care time)  Medications Ordered in ED Medications  albuterol (VENTOLIN HFA) 108 (90 Base) MCG/ACT inhaler 4 puff (has no administration in time range)  sodium chloride 0.9 % bolus 500 mL (has no administration in time range)     Initial Impression / Assessment and Plan / ED Course  I have reviewed the triage vital signs and the nursing notes.  Pertinent labs & imaging results that were available during my care of the patient were reviewed by me and considered in my medical decision making (see chart for details).  Iv ns. Continuous pulse ox and monitor. o2 [place]. Albuterol mdi treatment.   Stat pcxr and labs.   Ecg.   Reviewed nursing notes and prior charts for additional history.   Labs reviewed by me - initial hs trop - normal.  cxr reviewed by me - no pna.  Recheck wheezing improved but persists. Additional albuterol and atrovent mdi treatment. Prednisone po.  Recheck pt comfortable. Nad. covid neg.   [name] was evaluated in Emergency Department on 08/23/2018 for the symptoms described in the history of present illness. He was evaluated in the context of the global COVID-19 pandemic, which necessitated consideration that the patient might be at risk for infection with the SARS-CoV-2 virus that causes COVID-19. Institutional protocols and algorithms that pertain to the evaluation of patients at risk for COVID-19 are in a state of rapid change based on information released by regulatory bodies including the CDC and federal and state organizations. These policies and algorithms were followed during the patient's care in the ED.  2305 - delta troponin pending - patient signed out to Dr Dina Rich.    Final  Clinical Impressions(s) / ED Diagnoses   Final diagnoses:  None    ED Discharge Orders    None       Lajean Saver, MD 08/23/18 2309

## 2018-08-23 NOTE — Discharge Instructions (Addendum)
It was our pleasure to provide your ER care today - we hope that you feel better.  For wheezing, take prednisone as prescribed. Use albuterol inhaler as need. Avoid any smoking.   For chest discomfort, follow up with cardiologist in the next 1-2 weeks - call office to arrange appointment.   Return to ER if worse, new symptoms, increased trouble breathing, persistent/recurrent chest pain, or other concern.

## 2018-08-24 LAB — TROPONIN I (HIGH SENSITIVITY): Troponin I (High Sensitivity): 8 ng/L (ref <18)

## 2018-08-24 NOTE — ED Notes (Signed)
Patient verbalizes understanding of discharge instructions. Opportunity for questioning and answers were provided. Armband removed by staff, pt discharged from ED.  

## 2018-08-29 ENCOUNTER — Telehealth (HOSPITAL_COMMUNITY): Payer: Self-pay

## 2018-08-29 NOTE — Telephone Encounter (Signed)
Pt. s wife called and requested copies of his Covid test to be sent to his Job via email.  Informed pt. That I was unable to do this, and I gave her information on My Chart.  She can view and print results from there.  She verbalized understanding and reported that she has forgotten the sign -on information.  I gave pt.The My Chart number on her discharge instructions for IT help.  The verbalized understanding and all questions answered.

## 2019-08-30 IMAGING — DX PORTABLE CHEST - 1 VIEW
1 series · 1 of 1 positions shown · non-contrast
Comparison: None.

CLINICAL DATA: Reason for exam: cough Pt c/o CP and SOB x 3 wks;
today pain became worse, and SOB worsened with movement; CP is
centralized, radiating to left arm

EXAM:
PORTABLE CHEST 1 VIEW

[chest ap]
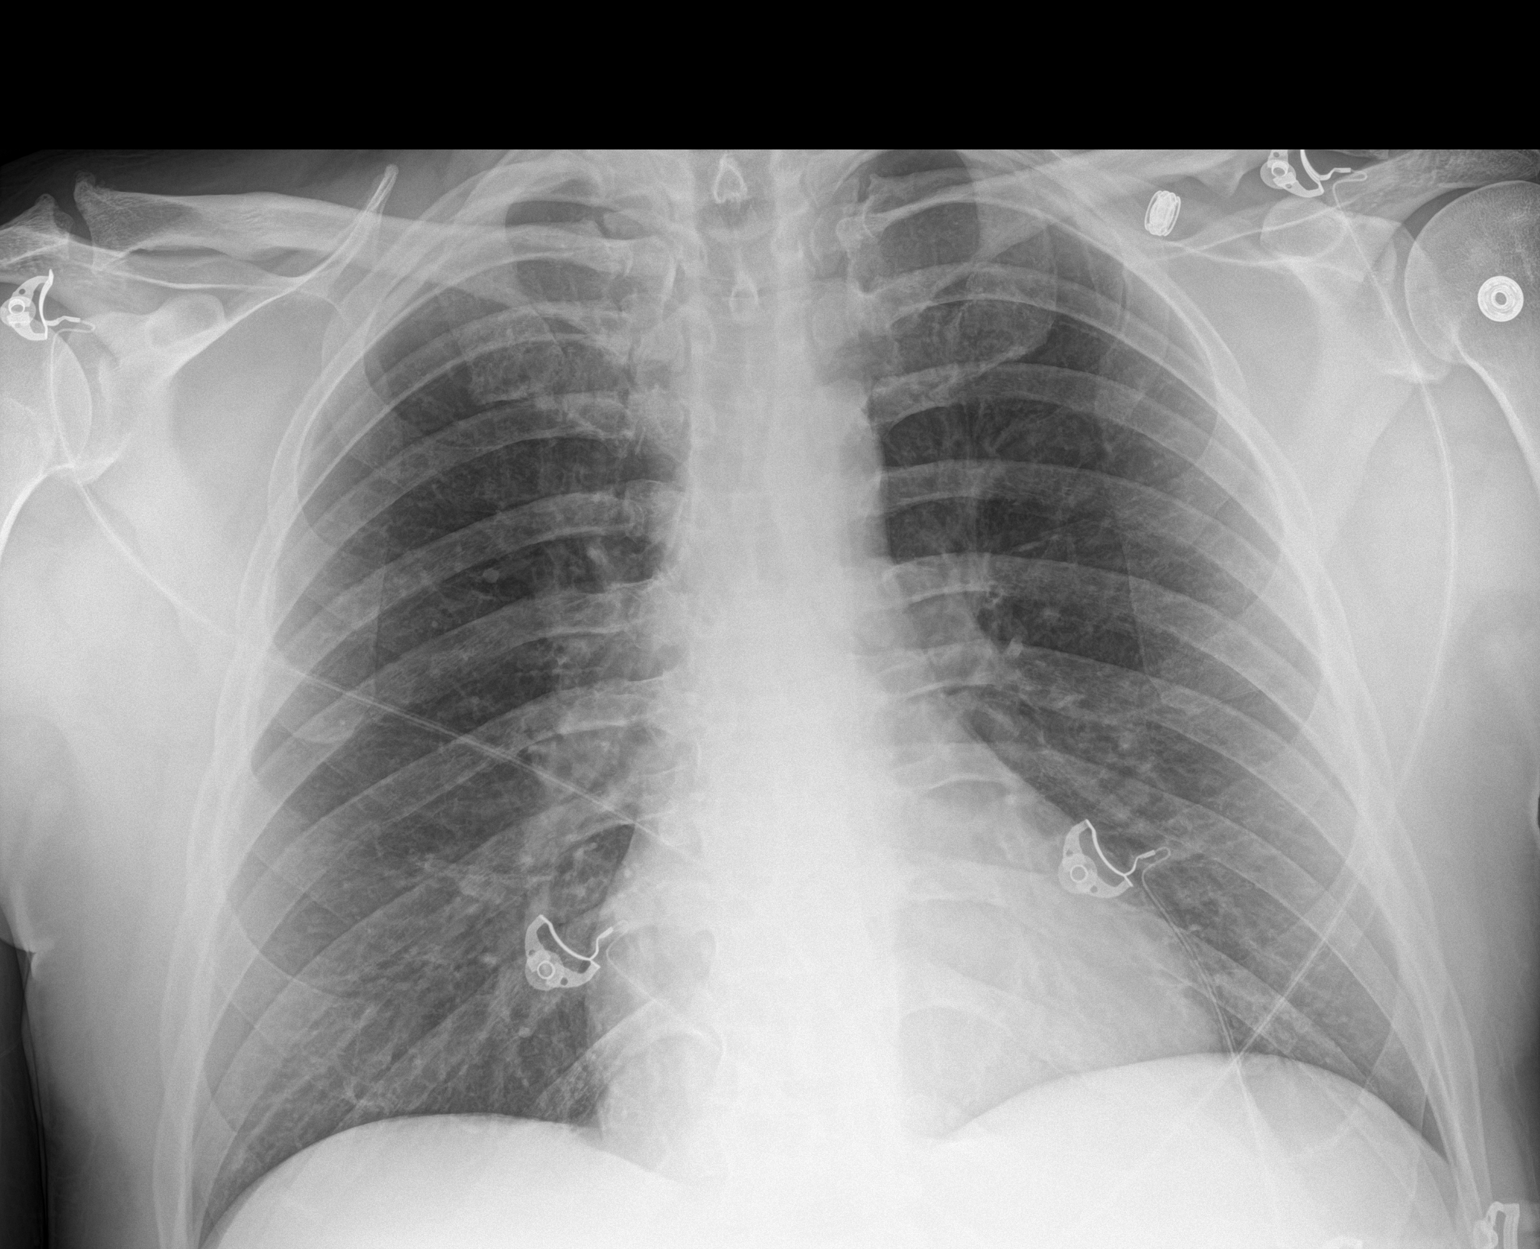

[1 of 1 positions shown; findings below may reference images not displayed]

FINDINGS: The heart size and mediastinal contours are within normal limits.
Both lungs are clear. The visualized skeletal structures are
unremarkable.
IMPRESSION: No active disease.

## 2023-04-08 DIAGNOSIS — Z419 Encounter for procedure for purposes other than remedying health state, unspecified: Secondary | ICD-10-CM | POA: Diagnosis not present

## 2023-05-17 ENCOUNTER — Telehealth: Payer: Self-pay | Admitting: Family Medicine

## 2023-05-17 ENCOUNTER — Other Ambulatory Visit: Payer: Self-pay

## 2023-05-17 ENCOUNTER — Ambulatory Visit: Payer: Self-pay

## 2023-05-17 NOTE — Telephone Encounter (Signed)
Patient's mother informed of the message below and voiced understanding

## 2023-05-17 NOTE — Telephone Encounter (Signed)
 Copied from CRM [id number]. Topic: Appointments - Appointment Scheduling >> May 17, 2023 10:39 AM [name] wrote: Patient/patient representative is calling to schedule an appointment. Refer to attachments for appointment information. [name] who is  a patient of Dr.Burchette would like to know if he is willing to accept her son as a new patient. He is currently going through heroin withdrawals and just is in a lot of pain. I offered to transfer to a triage nurse but they refused and just want to know if he can see Dr.Burchette. Best call back number is [phone].

## 2023-05-17 NOTE — Telephone Encounter (Signed)
  Chief Complaint: Generalized Body Pain from heroin withdrawals Symptoms: body pain from withdrawal-patient states he hasn't had heroin in more than 2 days Frequency: started today Pertinent Negatives: Patient denies CP, SOB, fever Disposition: [x] ED /[] Urgent Care (no appt availability in office) / [] Appointment(In office/virtual)/ []  [place] [facility]/ [] Home Care/ [] Refused Recommended Disposition /[]  Mobile Bus/ []  Follow-up with PCP Additional Notes: patient's mother called for patient with concerns for generalized body pain from heroin withdrawals. Mother states patient's pain is 10 out of 10. Denies CP, SOB or fever. Per protocol, patient is recommended to the ED. Patient's mother verbalized understanding of plan and all questions answered. Encouraged to call back to set patient up with a new patient appointment after he has been seen in the ED.   Copied from CRM [id number]. Topic: Clinical - Red Word Triage >> May 17, 2023 10:07 AM [place] J wrote: [facility] that prompted transfer to Nurse Triage: Patient is going through withdrawals and is going through immense pain. Reason for Disposition  Patient sounds very sick or weak to the triager  Answer Assessment - Initial Assessment Questions 1. ONSET: "When did the muscle aches or body pains start?"      Generalized body pain from heroin withdrawals 2. LOCATION: "What part of your body is hurting?" (e.g., entire body, arms, legs)      everywhere 3. SEVERITY: "How bad is the pain?" (Scale 1-10; or mild, moderate, severe)   - MILD (1-3): doesn't interfere with normal activities    - MODERATE (4-7): interferes with normal activities or awakens from sleep    - SEVERE (8-10):  excruciating pain, unable to do any normal activities      10 out of 10 4. CAUSE: "What do you think is causing the pains?"     Withdrawal from heroin-more than 2 days 5. FEVER: "Have you been having fever?"     no 6. OTHER SYMPTOMS: "Do you have any  other symptoms?" (e.g., chest pain, weakness, rash, cold or flu symptoms, weight loss)     Just general pain  Protocols used: Muscle Aches and Body Pain-A-AH

## 2023-05-20 DIAGNOSIS — Z419 Encounter for procedure for purposes other than remedying health state, unspecified: Secondary | ICD-10-CM | POA: Diagnosis not present

## 2023-05-22 ENCOUNTER — Ambulatory Visit: Payer: Self-pay | Admitting: *Deleted

## 2023-05-22 NOTE — Telephone Encounter (Signed)
 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 Patient has been withdrawing and was advised ED. He did go- but left while waiting. His brother was taking him to treatment center and he declined to go- he has been at home since. Patient is having symptoms- pacing, unable to sleep, agitation, crying Symptoms: see above Frequency: 05/17/23- first call seeking assistance  Pertinent Negatives: Patient denies suicidal thoughts- he has not threatened to harm himself or others  Disposition: [] ED /[x] Urgent Care (no appt availability in office) / [] Appointment(In office/virtual)/ []  [facility]/ [] Home Care/ [] Refused Recommended Disposition /[] [facility]/ []  Follow-up with PCP Additional Notes: Mother is seeking advice on treatment: resources given include: [facility], [facility], [facility]- addresses and numbers given   Reason for Disposition  [1] Depression AND [2] worsening (e.g., sleeping poorly, less able to do activities of daily living)  Answer Assessment - Initial Assessment Questions 1. CONCERN: "What happened that made you call today?"     Depression- patient has stopped using heroin 2. DEPRESSION SYMPTOM SCREENING: "How are you feeling overall?" (e.g., decreased energy, increased sleeping or difficulty sleeping, difficulty concentrating, feelings of sadness, guilt, hopelessness, or worthlessness)     Crying, pacing, agitated 3. RISK OF HARM - SUICIDAL IDEATION:  "Do you ever have thoughts of hurting or killing yourself?"  (e.g., yes, no, no but preoccupation with thoughts about death)   - INTENT:  "Do you have thoughts of hurting or killing yourself right NOW?" (e.g., yes, no, N/A)   - PLAN: "Do you have a specific plan for how you would do this?" (e.g., gun, knife, overdose, no plan, N/A)     No- patient has not expressed suicidal thoughts 4. RISK OF HARM - HOMICIDAL IDEATION:  "Do you ever have  thoughts of hurting or killing someone else?"  (e.g., yes, no, no but preoccupation with thoughts about death)   - INTENT:  "Do you have thoughts of hurting or killing someone right NOW?" (e.g., yes, no, N/A)   - PLAN: "Do you have a specific plan for how you would do this?" (e.g., gun, knife, no plan, N/A)      no 5. FUNCTIONAL IMPAIRMENT: "How have things been going for you overall? Have you had more difficulty than usual doing your normal daily activities?"  (e.g., better, same, worse; self-care, school, work, interactions)     Not sleeping, feels sick 6. SUPPORT: "Who is with you now?" "Who do you live with?" "Do you have family or friends who you can talk to?"      family 7. THERAPIST: "Do you have a counselor or therapist? Name?"     no 8. STRESSORS: "Has there been any new stress or recent changes in your life?"     Martial loss, money concerns, unable to work 9. ALCOHOL USE OR SUBSTANCE USE (DRUG USE): "Do you drink alcohol or use any illegal drugs?"     Heroin- 10 years- has just quit 10. OTHER: "Do you have any other physical symptoms right now?" (e.g., fever)       Not sleeping, pacing, anxious  Protocols used: Depression-A-AH

## 2023-05-22 NOTE — Telephone Encounter (Signed)
  Attempted to call patient- left message to call nurse triage on number provided.   Copied From CRM [id number]. Reason for Triage: depressed,down,possible drug withdrawals

## 2023-06-02 ENCOUNTER — Encounter: Payer: Self-pay | Admitting: Urgent Care

## 2023-06-02 ENCOUNTER — Ambulatory Visit: Admitting: Urgent Care

## 2023-06-02 ENCOUNTER — Telehealth: Payer: Self-pay

## 2023-06-02 VITALS — BP 111/69 | HR 56 | Ht 73.0 in | Wt 224.1 lb

## 2023-06-02 DIAGNOSIS — R110 Nausea: Secondary | ICD-10-CM | POA: Diagnosis not present

## 2023-06-02 DIAGNOSIS — F1193 Opioid use, unspecified with withdrawal: Secondary | ICD-10-CM | POA: Diagnosis not present

## 2023-06-02 DIAGNOSIS — R61 Generalized hyperhidrosis: Secondary | ICD-10-CM

## 2023-06-02 DIAGNOSIS — M791 Myalgia, unspecified site: Secondary | ICD-10-CM | POA: Diagnosis not present

## 2023-06-02 DIAGNOSIS — R519 Headache, unspecified: Secondary | ICD-10-CM

## 2023-06-02 LAB — CBC WITH DIFFERENTIAL/PLATELET
Basophils Absolute: 0 10*3/uL (ref 0.0–0.1)
Basophils Relative: 0.7 % (ref 0.0–3.0)
Eosinophils Absolute: 0.2 10*3/uL (ref 0.0–0.7)
Eosinophils Relative: 2.6 % (ref 0.0–5.0)
HCT: 45.5 % (ref 39.0–52.0)
Hemoglobin: 15 g/dL (ref 13.0–17.0)
Lymphocytes Relative: 33.8 % (ref 12.0–46.0)
Lymphs Abs: 2.3 10*3/uL (ref 0.7–4.0)
MCHC: 32.9 g/dL (ref 30.0–36.0)
MCV: 90.2 fl (ref 78.0–100.0)
Monocytes Absolute: 0.5 10*3/uL (ref 0.1–1.0)
Monocytes Relative: 7.3 % (ref 3.0–12.0)
Neutro Abs: 3.8 10*3/uL (ref 1.4–7.7)
Neutrophils Relative %: 55.6 % (ref 43.0–77.0)
Platelets: 182 10*3/uL (ref 150.0–400.0)
RBC: 5.05 Mil/uL (ref 4.22–5.81)
RDW: 14.1 % (ref 11.5–15.5)
WBC: 6.9 10*3/uL (ref 4.0–10.5)

## 2023-06-02 LAB — COMPREHENSIVE METABOLIC PANEL WITH GFR
ALT: 11 U/L (ref 0–53)
AST: 13 U/L (ref 0–37)
Albumin: 4.2 g/dL (ref 3.5–5.2)
Alkaline Phosphatase: 70 U/L (ref 39–117)
BUN: 17 mg/dL (ref 6–23)
CO2: 32 meq/L (ref 19–32)
Calcium: 9.4 mg/dL (ref 8.4–10.5)
Chloride: 103 meq/L (ref 96–112)
Creatinine, Ser: 1.06 mg/dL (ref 0.40–1.50)
GFR: 80.29 mL/min (ref >60.00)
Glucose, Bld: 66 mg/dL — ABNORMAL LOW (ref 70–99)
Potassium: 5.1 meq/L (ref 3.5–5.1)
Sodium: 139 meq/L (ref 135–145)
Total Bilirubin: 0.4 mg/dL (ref 0.2–1.2)
Total Protein: 6.5 g/dL (ref 6.0–8.3)

## 2023-06-02 LAB — LIPID PANEL
Cholesterol: 150 mg/dL (ref 0–200)
HDL: 50 mg/dL (ref >39.00)
LDL Cholesterol: 84 mg/dL (ref 0–99)
NonHDL: 100.2
Total CHOL/HDL Ratio: 3
Triglycerides: 81 mg/dL (ref 0.0–149.0)
VLDL: 16.2 mg/dL (ref 0.0–40.0)

## 2023-06-02 LAB — HEMOGLOBIN A1C: Hgb A1c MFr Bld: 5.8 % (ref 4.6–6.5)

## 2023-06-02 LAB — TSH: TSH: 0.67 u[IU]/mL (ref 0.35–5.50)

## 2023-06-02 MED ORDER — CLONIDINE HCL 0.1 MG PO TABS: 0.0500 mg | ORAL_TABLET | Freq: Two times a day (BID) | ORAL | 0 refills | Status: AC

## 2023-06-02 MED ORDER — NALTREXONE HCL 50 MG PO TABS: 25.0000 mg | ORAL_TABLET | Freq: Every day | ORAL | 0 refills | Status: AC

## 2023-06-02 NOTE — Progress Notes (Signed)
 New Patient Office Visit  Subjective:  Patient ID: [name], male    DOB: [birth year]  Age: 53 y.o. MRN: [id number]  CC:  Chief Complaint  Patient presents with   Establish Care    New pt est care. Pt has been detoxing off of drugs and has been very sick. He would like some help.    HPI [name] presents to establish care  Discussed the use of AI scribe software for clinical note transcription with the patient, who gave verbal consent to proceed.  History of Present Illness   [name] is a 53 year old male who presents with opioid withdrawal symptoms.  He has been experiencing opioid withdrawal symptoms for the past 13 days after deciding to quit cold [place]. He has been on opioids for 13 years, initially prescribed following a surgery in 2011. Symptoms include persistent nausea, muscle aches, sweating, headaches, and insomnia. Pt went to ER on 05/17/23 due to withdrawal, but left without being seen due to wait times. He states symptoms have significantly improved since but are still present.  He has not attempted to quit opioids before and has not been taking any over-the-counter medications to alleviate his symptoms, except for Tylenol . He reports fluctuations in blood pressure, particularly feeling hot at night, but no palpitations or racing heart. He experienced gastrointestinal symptoms like diarrhea, which have since resolved.  He has no known past medical history of high blood pressure or diabetes and is not currently taking any prescription medications. He has not seen a doctor in five years and has no chronic pain other than withdrawal symptoms. He was briefly incarcerated, during which he was given Suboxone.  Socially, he smokes and has been doing so for 20 years. He does not consume alcohol. He is currently unemployed and spends his time at home, occasionally riding motorcycles and doing yard work. He maintains a normal appetite and eats three meals a  day.       Outpatient Encounter Medications as of 06/02/2023  Medication Sig   cloNIDine (CATAPRES) 0.1 MG tablet Take 0.5 tablets (0.05 mg total) by mouth 2 (two) times daily.   naltrexone (DEPADE) 50 MG tablet Take 0.5 tablets (25 mg total) by mouth daily.   acetaminophen  (TYLENOL ) 500 MG tablet Take 500-1,000 mg by mouth every 6 (six) hours as needed (for pain or headaches).  (Patient not taking: Reported on 06/02/2023)   albuterol  (VENTOLIN  HFA) 108 (90 Base) MCG/ACT inhaler Inhale 2 puffs into the lungs every 4 (four) hours as needed for wheezing or shortness of breath. (Patient not taking: Reported on 06/02/2023)   predniSONE  (DELTASONE ) 20 MG tablet Take 3 tablets (60 mg total) by mouth daily. (Patient not taking: Reported on 06/02/2023)   No facility-administered encounter medications on file as of 06/02/2023.    Past Medical History:  Diagnosis Date   Depression    Drug abuse and dependence (HCC)    Medical history non-contributory     Past Surgical History:  Procedure Laterality Date   CHOLECYSTECTOMY N/A 02/11/2016   Procedure: LAPAROSCOPIC CHOLECYSTECTOMY;  Surgeon: Joyce Nixon, MD;  Location: [facility];  Service: General;  Laterality: N/A;   ENDOSCOPIC RETROGRADE CHOLANGIOPANCREATOGRAPHY (ERCP) WITH PROPOFOL  N/A 02/14/2016   Procedure: ENDOSCOPIC RETROGRADE CHOLANGIOPANCREATOGRAPHY (ERCP) WITH PROPOFOL ;  Surgeon: Ozell Blunt, MD;  Location: [facility];  Service: Endoscopy;  Laterality: N/A;   ESOPHAGOGASTRODUODENOSCOPY (EGD) WITH PROPOFOL  N/A 04/19/2016   Procedure: ESOPHAGOGASTRODUODENOSCOPY (EGD) WITH PROPOFOL ;  Surgeon: Ozell Blunt, MD;  Location:  [facility];  Service: Endoscopy;  Laterality: N/A;  with ercp scope   GASTROINTESTINAL STENT REMOVAL N/A 04/19/2016   Procedure: GASTROINTESTINAL STENT REMOVAL;  Surgeon: Ozell Blunt, MD;  Location: [facility];  Service: Endoscopy;  Laterality: N/A;   NO PAST SURGERIES      Family History  Problem Relation Age of Onset    Hypertension Mother    Hypertension Maternal Grandmother    Diabetes Maternal Grandmother    Heart disease Paternal Grandfather    Diabetes Paternal Grandfather     Social History   Socioeconomic History   Marital status: Legally Separated    Spouse name: Not on file   Number of children: Not on file   Years of education: Not on file   Highest education level: Not on file  Occupational History   Not on file  Tobacco Use   Smoking status: Every Day    Current packs/day: 1.50    Average packs/day: 1.5 packs/day for 20.0 years (30.0 ttl pk-yrs)    Types: Cigarettes   Smokeless tobacco: Never  Substance and Sexual Activity   Alcohol use: No   Drug use: No   Sexual activity: Not on file  Other Topics Concern   Not on file  Social History Narrative   Not on file   Social Drivers of Health   Financial Resource Strain: Not on file  Food Insecurity: Not on file  Transportation Needs: Not on file  Physical Activity: Not on file  Stress: Not on file  Social Connections: Not on file  Intimate Partner Violence: Not on file    ROS: as noted in HPI  Objective:  BP 111/69   Pulse (!) 56   Ht 6\' 1"  (1.854 m)   Wt 224 lb 1.9 oz (101.7 kg)   SpO2 97%   BMI 29.57 kg/m   Physical Exam Vitals and nursing note reviewed.  Constitutional:      General: He is not in acute distress.    Appearance: Normal appearance. He is not ill-appearing, toxic-appearing or diaphoretic.  HENT:     Head: Normocephalic and atraumatic.     Right Ear: Tympanic membrane, ear canal and external ear normal. There is no impacted cerumen.     Left Ear: Tympanic membrane, ear canal and external ear normal. There is no impacted cerumen.     Nose: Nose normal.     Mouth/Throat:     Mouth: Mucous membranes are moist.     Pharynx: Oropharynx is clear. No oropharyngeal exudate or posterior oropharyngeal erythema.  Eyes:     General: No scleral icterus.       Right eye: No discharge.        Left eye: No  discharge.     Extraocular Movements: Extraocular movements intact.     Left eye: Abnormal extraocular motion present.     Pupils: Pupils are equal, round, and reactive to light.  Neck:     Thyroid: No thyroid mass, thyromegaly or thyroid tenderness.  Cardiovascular:     Rate and Rhythm: Normal rate and regular rhythm.     Pulses: Normal pulses.     Heart sounds: No murmur heard. Pulmonary:     Effort: Pulmonary effort is normal. No respiratory distress.     Breath sounds: Normal breath sounds. No stridor. No wheezing or rhonchi.  Abdominal:     General: Abdomen is flat. Bowel sounds are normal. There is no distension.     Palpations: Abdomen is soft. There is no  mass.     Tenderness: There is no abdominal tenderness. There is no guarding.  Musculoskeletal:     Cervical back: Normal range of motion and neck supple. No rigidity or tenderness.     Right lower leg: No edema.     Left lower leg: No edema.  Lymphadenopathy:     Cervical: No cervical adenopathy.  Skin:    General: Skin is warm and dry.     Coloration: Skin is not jaundiced.     Findings: No bruising, erythema or rash.  Neurological:     General: No focal deficit present.     Mental Status: He is alert and oriented to person, place, and time.     Sensory: No sensory deficit.     Motor: No weakness.  Psychiatric:        Mood and Affect: Mood normal.        Behavior: Behavior normal.       Assessment & Plan:  Opioid use with withdrawal (HCC) -     cloNIDine HCl; Take 0.5 tablets (0.05 mg total) by mouth 2 (two) times daily.  Dispense: 60 tablet; Refill: 0 -     Naltrexone HCl; Take 0.5 tablets (25 mg total) by mouth daily.  Dispense: 30 tablet; Refill: 0 -     CBC with Differential/Platelet -     Hemoglobin A1c -     TSH -     Lipid panel -     Comprehensive metabolic panel with GFR -     Ambulatory referral to Behavioral Health  Nausea -     cloNIDine HCl; Take 0.5 tablets (0.05 mg total) by mouth 2 (two)  times daily.  Dispense: 60 tablet; Refill: 0 -     Naltrexone HCl; Take 0.5 tablets (25 mg total) by mouth daily.  Dispense: 30 tablet; Refill: 0 -     CBC with Differential/Platelet -     Hemoglobin A1c -     TSH -     Lipid panel -     Comprehensive metabolic panel with GFR  Excessive sweating -     cloNIDine HCl; Take 0.5 tablets (0.05 mg total) by mouth 2 (two) times daily.  Dispense: 60 tablet; Refill: 0 -     Naltrexone HCl; Take 0.5 tablets (25 mg total) by mouth daily.  Dispense: 30 tablet; Refill: 0 -     CBC with Differential/Platelet -     Hemoglobin A1c -     TSH -     Lipid panel -     Comprehensive metabolic panel with GFR  Myalgia -     cloNIDine HCl; Take 0.5 tablets (0.05 mg total) by mouth 2 (two) times daily.  Dispense: 60 tablet; Refill: 0 -     Naltrexone HCl; Take 0.5 tablets (25 mg total) by mouth daily.  Dispense: 30 tablet; Refill: 0 -     CBC with Differential/Platelet -     Hemoglobin A1c -     TSH -     Lipid panel -     Comprehensive metabolic panel with GFR  Intractable headache, unspecified chronicity pattern, unspecified headache type -     cloNIDine HCl; Take 0.5 tablets (0.05 mg total) by mouth 2 (two) times daily.  Dispense: 60 tablet; Refill: 0 -     Naltrexone HCl; Take 0.5 tablets (25 mg total) by mouth daily.  Dispense: 30 tablet; Refill: 0 -     CBC with Differential/Platelet -  Hemoglobin A1c -     TSH -     Lipid panel -     Comprehensive metabolic panel with GFR  Assessment and Plan    Opioid withdrawal Experiencing withdrawal symptoms after abrupt cessation 13 days ago. Symptoms include nausea, myalgia, diaphoresis, insomnia, headaches, and labile blood pressure. Blood pressure currently well-managed. Suboxone not recommended due to risks and absence of chronic pain. Additionally, pt denies any cravings. Clonidine considered for symptom management with caution due to hypotensive effects. - Refer to substance abuse center to prevent  relapse. - Prescribe clonidine at lowest dose, instruct to break pill in half to avoid hypotension. - Ensure adequate hydration. - Order lab tests to check for metabolic derangements.  - return in one week, ER for any acute changes   **Addendum - after discussion with supervising MD, recommendation was to place naltrexone on hold until patients follow up in two weeks at which time will only be released if sx of relapse occur.      Return in about 1 week (around 06/09/2023).   Mandy Second, PA

## 2023-06-02 NOTE — Telephone Encounter (Signed)
 Pt was contacted by provider, three identifiers obtained. Instructed pt that both were sent in, however after discussion with supervising MD, would recommend holding off on naltrexone until follow up and only start it if cravings start. Pharmacy was contacted to place this medication on a two week hold. Will discuss at his follow up. Pt stated understanding, all questions answered.

## 2023-06-02 NOTE — Telephone Encounter (Signed)
 Copied from CRM [id number]. Topic: Clinical - Prescription Issue >> Jun 02, 2023  3:45 PM Deaijah H wrote: Reason for CRM: Patients mom called in stating pharmacy is stating one prescription was not called in (naltrexone (DEPADE) 50 MG tablet)

## 2023-06-02 NOTE — Patient Instructions (Signed)
 Please start taking clonidine 1/2 tab twice daily. Monitor your blood pressure as it can decrease. This medication can cause drowsiness. Please also start taking one tab of naltrexone daily.  We drew your labs today. Please schedule a follow up in one week.  Please follow up with the substance abuse center.

## 2023-06-05 ENCOUNTER — Ambulatory Visit: Payer: Self-pay

## 2023-06-05 ENCOUNTER — Other Ambulatory Visit: Payer: Self-pay | Admitting: Urgent Care

## 2023-06-05 DIAGNOSIS — F1193 Opioid use, unspecified with withdrawal: Secondary | ICD-10-CM

## 2023-06-05 NOTE — Telephone Encounter (Signed)
 Information obtained from [place], mother.   Mother states that the pt has a heart rate of 49 and is unsure if it is medication related.  Symptoms: denies Frequency: today Pertinent Negatives: Patient denies CP, SOB, bleeding, fever, sweats, hallucinations Disposition: [] ED /[] Urgent Care (no appt availability in office) / [] Appointment(In office/virtual)/ []  [facility]/ [] Home Care/ [] Refused Recommended Disposition /[] [facility]/ [x]  Follow-up with PCP Additional Notes: Pt mother states that he has been taking the clonidine since 4/25. Pt is given this medication for opioid withdrawal. Mother states that pt is not wanting to eat. Per mother pt denies SOB, CP, ABD pain, fever, sweating. RN confirmed that pt has been taking prescribed dose. Most recent OV pt BP 111/69 HR 56 on 4/25. Today at time of call BP 118/71 HR  49. Pt instructed to utilize ED if becomes symptomatic, advised to hold clonidine until PCP advises or HR above 60. Mother agreeable. RN confirmed again that pt is not symptomatic.  Copied from CRM [id number]. Topic: Clinical - Red Word Triage >> Jun 05, 2023  5:26 PM Jethro Morrison wrote: Red Word that prompted transfer to Nurse Triage: HEART RATE 49 TAKING cloNIDine (CATAPRES) 0.1 MG tablet Reason for Disposition . Substance use (drug use) or misuse, known or suspected  Answer Assessment - Initial Assessment Questions 1. NAME of MEDICINE: "What medicine(s) are you calling about?"     clonidine 2. QUESTION: "What is your question?" (e.g., double dose of medicine, side effect)     Should I give the evening dose 3. PRESCRIBER: "Who prescribed the medicine?" Reason: if prescribed by specialist, call should be referred to that group.     PCP PA WC 4. SYMPTOMS: "Do you have any symptoms?" If Yes, ask: "What symptoms are you having?"  "How bad are the symptoms (e.g., mild, moderate, severe)     HR 49  Answer Assessment - Initial Assessment Questions 1. DESCRIPTION:  "Please describe your heart rate or heartbeat that you are having" (e.g., fast/slow, regular/irregular, skipped or extra beats, "palpitations")     HR 49 2. ONSET: "When did it start?" (Minutes, hours or days)      This afternoon 3. DURATION: "How long does it last" (e.g., seconds, minutes, hours)     Pt HR in chart is 56 at last visit on 4/25 4. PATTERN "Does it come and go, or has it been constant since it started?"  "Does it get worse with exertion?"   "Are you feeling it now?"     Mother states this morning that his HR was 63 before morning dose 6. HEART RATE: "Can you tell me your heart rate?" "How many beats in 15 seconds?"  (Note: not all patients can do this)       49 7. RECURRENT SYMPTOM: "Have you ever had this before?" If Yes, ask: "When was the last time?" and "What happened that time?"      Denies, per chart pt has HR in the 50s regularly 8. CAUSE: "What do you think is causing the palpitations?"     clonidine 9. CARDIAC HISTORY: "Do you have any history of heart disease?" (e.g., heart attack, angina, bypass surgery, angioplasty, arrhythmia)      denies 10. OTHER SYMPTOMS: "Do you have any other symptoms?" (e.g., dizziness, chest pain, sweating, difficulty breathing)       denies  Protocols used: Medication Question Call-A-AH, Heart Rate and Heartbeat Questions-A-AH

## 2023-06-05 NOTE — Telephone Encounter (Signed)
 No further action needed.

## 2023-06-06 ENCOUNTER — Ambulatory Visit: Payer: Self-pay

## 2023-06-06 NOTE — Telephone Encounter (Signed)
Please further advise.

## 2023-06-06 NOTE — Telephone Encounter (Deleted)
 Copied from CRM [id number]. Topic: Clinical - Red Word Triage >> Jun 06, 2023  1:51 PM [place] A wrote: [facility] that prompted transfer to Nurse Triage: Patient's mother is calling because patient is going through [facility] energy, heart rate fluctuates.   Answer Assessment - Initial Assessment Questions 1. DRUG: "What drug(s) are you using?"      *No Answer* 2. ROUTE: "How are you using this drug?" (e.g., swallow, smoke, inject, huff, snort)     *No Answer* 3. HOW OFTEN: "How many days per week do you typically use it?"     *No Answer* 4. HOW MUCH: "How much do you use?"      *No Answer* 5. LAST 24 HOURS: "Have you used it in the last 24 hours?"     *No Answer* 6. SYMPTOMS: "What symptoms are you currently experiencing?" (e.g., none, headache, chest pain, abdomen pain, vomiting)     Crying, agitation, low energy, tired & urge for drugs, nausea, sweats 7. DETOX PROGRAM: "Have you ever gone through a substance use treatment program (detox program)?"     *No Answer* 8. THERAPIST: "Do you have a counselor or therapist? Name?"     *No Answer* 9. SUPPORT: "Who is with you now?" "Who do you live with?" "Do you have family or friends who you can talk to?"      *No Answer* 10. PREGNANCY: "Is there any chance you are pregnant?" "When was your last menstrual period?"       *No Answer*  Answer Assessment - Initial Assessment Questions 1. REASON FOR CALL or QUESTION: "What is your reason for calling today?" or "How can I best help you?" or "What question do you have that I can help answer?"     Pt's mother called concerned about her son,  stated at present time son is going through withdrawal from heroin - last date used on 05/15/2023.  PCP gave medication to assist pt with withdrawals but pt is still having a hard time with withdrawals and Mom would like to know if PCP can prescribe something else to help.  Mom stated patient is crying, craving heroin, sweating, lack of  energy, agitated, heart rate goes up and down.  Mom & son are afraid if he does not get something more to help with these withdraws s/s that the pt would end up using again and they are trying to prevent this from happening.  Please call Mom/pt back to update on next course of action.  Protocols used: Opioid Use and Problems-A-AH, Information Only Call - No Triage-A-AH

## 2023-06-06 NOTE — Telephone Encounter (Signed)
 LVM for pt to return call

## 2023-06-06 NOTE — Telephone Encounter (Signed)
LM for pt's mom to return call to discuss.

## 2023-06-06 NOTE — Telephone Encounter (Signed)
 Okay to continue clonidine 0.1 mg, one half tab twice a day.

## 2023-06-06 NOTE — Telephone Encounter (Signed)
 Copied from CRM [id number]. Topic: Clinical - Red Word Triage >> Jun 06, 2023  1:51 PM [place] A wrote: [facility] that prompted transfer to Nurse Triage: Patient's mother is calling because patient is going through [facility] energy, heart rate fluctuates.   Answer Assessment - Initial Assessment Questions 1. DRUG: "What drug(s) are you using?"      *No Answer* 2. ROUTE: "How are you using this drug?" (e.g., swallow, smoke, inject, huff, snort)     *No Answer* 3. HOW OFTEN: "How many days per week do you typically use it?"     *No Answer* 4. HOW MUCH: "How much do you use?"      *No Answer* 5. LAST 24 HOURS: "Have you used it in the last 24 hours?"     *No Answer* 6. SYMPTOMS: "What symptoms are you currently experiencing?" (e.g., none, headache, chest pain, abdomen pain, vomiting)     Crying, agitation, low energy, tired & urge for drugs, nausea, sweats 7. DETOX PROGRAM: "Have you ever gone through a substance use treatment program (detox program)?"     *No Answer* 8. THERAPIST: "Do you have a counselor or therapist? Name?"     *No Answer* 9. SUPPORT: "Who is with you now?" "Who do you live with?" "Do you have family or friends who you can talk to?"      *No Answer* 10. PREGNANCY: "Is there any chance you are pregnant?" "When was your last menstrual period?"       *No Answer*  Answer Assessment - Initial Assessment Questions 1. REASON FOR CALL or QUESTION: "What is your reason for calling today?" or "How can I best help you?" or "What question do you have that I can help answer?"     Pt's mother called concerned about her son,  stated at present time son is going through withdrawal from heroin - last date used on 05/15/2023.  PCP gave medication to assist pt with withdrawals but pt is still having a hard time with withdrawals and Mom would like to know if PCP can prescribe something else to help.  Mom stated patient is crying, craving heroin, sweating, lack of  energy, agitated, heart rate goes up and down.  Mom & son are afraid if he does not get something more to help with these withdraws s/s that the pt would end up using again and they are trying to prevent this from happening.  Please call Mom/pt back to update on next course of action.  Protocols used: Opioid Use and Problems-A-AH, Information Only Call - No Triage-A-AH

## 2023-06-06 NOTE — Telephone Encounter (Signed)
 Spoke with patient's mother and advised mother to take pt to the hospital. Mom states he will not go and is just requesting any other medication to help with withdrawals. Mom is requesting call back in the morning but will get him to the hospital if things get worse.  Please advise

## 2023-06-07 NOTE — Telephone Encounter (Signed)
 See other encounter (nurse triage)

## 2023-06-08 NOTE — Telephone Encounter (Signed)
 Spoke with pt and pt's mom to advise of provider instructions, pt's mom was thinking of taking him to the ER due to stomach pains and emotional crying pt has been doing. Pt was advised by CMA to proceed to ER for evaluation until appt with PCP on Monday.

## 2023-06-08 NOTE — Telephone Encounter (Signed)
 There is nothing different to give him at this time. For consideration of a medication to help him maintain opioid abstinence he needs to be rechecked in the office to discuss it thoroughly.

## 2023-06-09 ENCOUNTER — Other Ambulatory Visit: Payer: Self-pay

## 2023-06-09 ENCOUNTER — Encounter (HOSPITAL_BASED_OUTPATIENT_CLINIC_OR_DEPARTMENT_OTHER): Payer: Self-pay | Admitting: Emergency Medicine

## 2023-06-09 ENCOUNTER — Emergency Department (HOSPITAL_BASED_OUTPATIENT_CLINIC_OR_DEPARTMENT_OTHER)
Admission: EM | Admit: 2023-06-09 | Discharge: 2023-06-09 | Disposition: A | Discharge status: Home / Self Care | Attending: Emergency Medicine | Admitting: Emergency Medicine

## 2023-06-09 ENCOUNTER — Emergency Department (HOSPITAL_BASED_OUTPATIENT_CLINIC_OR_DEPARTMENT_OTHER)

## 2023-06-09 DIAGNOSIS — R079 Chest pain, unspecified: Secondary | ICD-10-CM | POA: Diagnosis not present

## 2023-06-09 DIAGNOSIS — R1013 Epigastric pain: Secondary | ICD-10-CM | POA: Diagnosis not present

## 2023-06-09 DIAGNOSIS — F17200 Nicotine dependence, unspecified, uncomplicated: Secondary | ICD-10-CM | POA: Diagnosis not present

## 2023-06-09 LAB — CBC WITH DIFFERENTIAL/PLATELET
Abs Immature Granulocytes: 0.02 10*3/uL (ref 0.00–0.07)
Basophils Absolute: 0 10*3/uL (ref 0.0–0.1)
Basophils Relative: 0 %
Eosinophils Absolute: 0.1 10*3/uL (ref 0.0–0.5)
Eosinophils Relative: 1 %
HCT: 45.9 % (ref 39.0–52.0)
Hemoglobin: 15.4 g/dL (ref 13.0–17.0)
Immature Granulocytes: 0 %
Lymphocytes Relative: 18 %
Lymphs Abs: 1.8 10*3/uL (ref 0.7–4.0)
MCH: 29.5 pg (ref 26.0–34.0)
MCHC: 33.6 g/dL (ref 30.0–36.0)
MCV: 87.9 fL (ref 80.0–100.0)
Monocytes Absolute: 0.4 10*3/uL (ref 0.1–1.0)
Monocytes Relative: 4 %
Neutro Abs: 7.4 10*3/uL (ref 1.7–7.7)
Neutrophils Relative %: 77 %
Platelets: 183 10*3/uL (ref 150–400)
RBC: 5.22 MIL/uL (ref 4.22–5.81)
RDW: 13.9 % (ref 11.5–15.5)
WBC: 9.7 10*3/uL (ref 4.0–10.5)
nRBC: 0 % (ref 0.0–0.2)

## 2023-06-09 LAB — COMPREHENSIVE METABOLIC PANEL WITH GFR
ALT: 11 U/L (ref 0–44)
AST: 16 U/L (ref 15–41)
Albumin: 4.5 g/dL (ref 3.5–5.0)
Alkaline Phosphatase: 76 U/L (ref 38–126)
Anion gap: 10 (ref 5–15)
BUN: 10 mg/dL (ref 6–20)
CO2: 26 mmol/L (ref 22–32)
Calcium: 10.5 mg/dL — ABNORMAL HIGH (ref 8.9–10.3)
Chloride: 104 mmol/L (ref 98–111)
Creatinine, Ser: 1.12 mg/dL (ref 0.61–1.24)
GFR, Estimated: >60 mL/min (ref >60)
Glucose, Bld: 95 mg/dL (ref 70–99)
Potassium: 4.6 mmol/L (ref 3.5–5.1)
Sodium: 140 mmol/L (ref 135–145)
Total Bilirubin: 0.3 mg/dL (ref 0.0–1.2)
Total Protein: 7.1 g/dL (ref 6.5–8.1)

## 2023-06-09 LAB — LIPASE, BLOOD: Lipase: 42 U/L (ref 11–51)

## 2023-06-09 LAB — TROPONIN T, HIGH SENSITIVITY: Troponin T High Sensitivity: <15 ng/L (ref <19)

## 2023-06-09 MED ORDER — ALUM & MAG HYDROXIDE-SIMETH 200-200-20 MG/5ML PO SUSP
30.0000 mL | Freq: Once | ORAL | Status: AC
  Administered 2023-06-09: 30 mL via ORAL
  Filled 2023-06-09: qty 30

## 2023-06-09 NOTE — Discharge Instructions (Signed)
Try pepcid or tagamet up to twice a day.  Try to avoid things that may make this worse, most commonly these are spicy foods tomato based products fatty foods chocolate and peppermint.  Alcohol and tobacco can also make this worse.  Return to the emergency department for sudden worsening pain fever or inability to eat or drink.  

## 2023-06-09 NOTE — ED Notes (Signed)
 Discharge paperwork given and verbally understood.

## 2023-06-09 NOTE — ED Triage Notes (Signed)
 Pt caox4, ambulatory c/o constant epigastric abd pain with nausea x1 wk.

## 2023-06-09 NOTE — ED Provider Notes (Signed)
 [place] EMERGENCY DEPARTMENT AT [facility] Provider Note   CSN: [id number] Arrival date & time: 06/09/23  7829     History  Chief Complaint  Patient presents with   Abdominal Pain    [name] is a 53 y.o. male.  53 yo M with a cc of epigastric pain.  This has been going on for about a week.  Seems to be worse at night and gets better throughout the day.  Not related to food as far as he can tell.  Does seem to be a bit worse when he exerts himself.  Epigastric pain.  Denies radiation.  Feels like a discomfort.  Was occurring occasionally and now is constant.  He denies cough congestion or fever denies nausea vomiting or diarrhea.  Has a history of a cholecystectomy.  Denies other abdominal surgeries.  Patient denies history of MI, denies hypertension hyperlipidemia diabetes.  His father had an MI in his 58s.  He is an everyday smoker.  Patient denies history of PE or DVT denies hemoptysis denies unilateral lower extremity edema denies recent surgery immobilization hospitalization estrogen use or history of cancer.     Abdominal Pain      Home Medications Prior to Admission medications   Medication Sig Start Date End Date Taking? Authorizing Provider  acetaminophen  (TYLENOL ) 500 MG tablet Take 500-1,000 mg by mouth every 6 (six) hours as needed (for pain or headaches).  Patient not taking: Reported on 06/02/2023    [provider]  albuterol  (VENTOLIN  HFA) 108 (90 Base) MCG/ACT inhaler Inhale 2 puffs into the lungs every 4 (four) hours as needed for wheezing or shortness of breath. Patient not taking: Reported on 06/02/2023 08/24/18   Guadalupe Lee, MD  cloNIDine  (CATAPRES ) 0.1 MG tablet Take 0.5 tablets (0.05 mg total) by mouth 2 (two) times daily. 06/02/23   Crain, Whitney L, PA  naltrexone  (DEPADE) 50 MG tablet Take 0.5 tablets (25 mg total) by mouth daily. 06/02/23   Crain, Whitney L, PA  predniSONE  (DELTASONE ) 20 MG tablet Take 3 tablets (60 mg  total) by mouth daily. Patient not taking: Reported on 06/02/2023 08/24/18   Steinl, Kevin, MD      Allergies    Patient has no known allergies.    Review of Systems   Review of Systems  Gastrointestinal:  Positive for abdominal pain.    Physical Exam Updated Vital Signs BP (!) 138/91   Pulse (!) 58   Temp 98.1 F (36.7 C) (Oral)   Resp (!) 22   Ht 6\' 1"  (1.854 m)   Wt 99.8 kg   SpO2 98%   BMI 29.03 kg/m  Physical Exam Vitals and nursing note reviewed.  Constitutional:      Appearance: He is well-developed.  HENT:     Head: Normocephalic and atraumatic.  Eyes:     Pupils: Pupils are equal, round, and reactive to light.  Neck:     Vascular: No JVD.  Cardiovascular:     Rate and Rhythm: Normal rate and regular rhythm.     Heart sounds: No murmur heard.    No friction rub. No gallop.  Pulmonary:     Effort: No respiratory distress.     Breath sounds: No wheezing.  Abdominal:     General: There is no distension.     Tenderness: There is abdominal tenderness. There is no guarding or rebound.     Comments: Diffuse upper abdominal discomfort  Musculoskeletal:  General: Normal range of motion.     Cervical back: Normal range of motion and neck supple.  Skin:    Coloration: Skin is not pale.     Findings: No rash.  Neurological:     Mental Status: He is alert and oriented to person, place, and time.  Psychiatric:        Behavior: Behavior normal.     ED Results / Procedures / Treatments   Labs (all labs ordered are listed, but only abnormal results are displayed) Labs Reviewed  COMPREHENSIVE METABOLIC PANEL WITH GFR - Abnormal; Notable for the following components:      Result Value   Calcium 10.5 (*)    All other components within normal limits  CBC WITH DIFFERENTIAL/PLATELET  LIPASE, BLOOD  TROPONIN T, HIGH SENSITIVITY    EKG EKG Interpretation Date/Time:  Friday Jun 09 2023 08:42:52 EDT Ventricular Rate:  57 PR Interval:  174 QRS  Duration:  117 QT Interval:  411 QTC Calculation: 401 R Axis:   74  Text Interpretation: Sinus rhythm Nonspecific intraventricular conduction delay No significant change since last tracing Confirmed by Albertus Hughs [phone]) on 06/09/2023 8:59:52 AM  Radiology DG Chest Port 1 View Result Date: 06/09/2023 CLINICAL DATA:  chest pain EXAM: PORTABLE CHEST 1 VIEW COMPARISON:  08/23/2018 FINDINGS: The heart size and mediastinal contours are within normal limits. Both lungs are clear. The visualized skeletal structures are unremarkable. IMPRESSION: No active disease. Electronically Signed   By: Melven Stable.  Shick M.D.   On: 06/09/2023 09:12    Procedures Procedures    Medications Ordered in ED Medications  alum & mag hydroxide-simeth (MAALOX/MYLANTA) 200-200-20 MG/5ML suspension 30 mL (30 mLs Oral Given 06/09/23 0907)    ED Course/ Medical Decision Making/ A&P                                 Medical Decision Making Amount and/or Complexity of Data Reviewed Labs: ordered. Radiology: ordered.  Risk OTC drugs.   53 yo M with a chief complaints of epigastric abdominal discomfort.  This been going on for about a week.  Seems to be worse with exertion and better with rest.  He feels similar to when he had had his gallbladder removed.  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  He had seen his family doctor for the symptoms that was thought to be related to withdrawal.  Patient does have some upper abdominal discomfort on exam.  Will obtain liver function test lipase.  With him having exertional symptoms we will obtain a troponin.  EKG without obvious ST changes from baseline.  Chest x-ray independently interpreted by me without focal infiltrate or pneumothorax.  Troponin negative.  With 1 week of symptoms and no significant change in the past 6 hours do not feel repeat is warranted.  No anemia, no significant electrolyte abnormalities.  LFTs and lipase are unremarkable.  Patient is feeling a bit  better on repeat assessment after GI cocktail.  I discussed results with him.  Will treat his reflux.  Trial of H2 blockers.  PCP follow-up.  10:22 AM:  I have discussed the diagnosis/risks/treatment options with the patient.  Evaluation and diagnostic testing in the emergency department does not suggest an emergent condition requiring admission or immediate intervention beyond what has been performed at this time.  They will follow up with PCP. We also discussed returning to the ED immediately if  new or worsening sx occur. We discussed the sx which are most concerning (e.g., sudden worsening pain, fever, inability to tolerate by mouth) that necessitate immediate return. Medications administered to the patient during their visit and any new prescriptions provided to the patient are listed below.  Medications given during this visit Medications  alum & mag hydroxide-simeth (MAALOX/MYLANTA) 200-200-20 MG/5ML suspension 30 mL (30 mLs Oral Given 06/09/23 0907)     The patient appears reasonably screen and/or stabilized for discharge and I doubt any other medical condition or other [facility] requiring further screening, evaluation, or treatment in the ED at this time prior to discharge.          Final Clinical Impression(s) / ED Diagnoses Final diagnoses:  Epigastric pain    Rx / DC Orders ED Discharge Orders     None         Albertus Hughs, DO 06/09/23 1022

## 2023-06-12 ENCOUNTER — Ambulatory Visit: Admitting: Urgent Care

## 2023-06-13 ENCOUNTER — Ambulatory Visit: Admitting: Urgent Care

## 2023-06-19 DIAGNOSIS — Z419 Encounter for procedure for purposes other than remedying health state, unspecified: Secondary | ICD-10-CM | POA: Diagnosis not present

## 2023-07-20 DIAGNOSIS — Z419 Encounter for procedure for purposes other than remedying health state, unspecified: Secondary | ICD-10-CM | POA: Diagnosis not present

## 2023-08-19 DIAGNOSIS — Z419 Encounter for procedure for purposes other than remedying health state, unspecified: Secondary | ICD-10-CM | POA: Diagnosis not present

## 2023-09-19 DIAGNOSIS — Z419 Encounter for procedure for purposes other than remedying health state, unspecified: Secondary | ICD-10-CM | POA: Diagnosis not present

## 2023-10-20 DIAGNOSIS — Z419 Encounter for procedure for purposes other than remedying health state, unspecified: Secondary | ICD-10-CM | POA: Diagnosis not present

## 2023-11-19 DIAGNOSIS — Z419 Encounter for procedure for purposes other than remedying health state, unspecified: Secondary | ICD-10-CM | POA: Diagnosis not present
